# Patient Record
Sex: Female | Born: 1985 | Race: Black or African American | Hispanic: No | Marital: Single | Smoking: Current some day smoker
Health system: Southern US, Community
[De-identification: ages and names within clinical notes are randomized; demographics above are authoritative.]

## PROBLEM LIST (undated history)

## (undated) DIAGNOSIS — I10 Essential (primary) hypertension: Secondary | ICD-10-CM

## (undated) DIAGNOSIS — G4730 Sleep apnea, unspecified: Secondary | ICD-10-CM

## (undated) HISTORY — PX: ROTATOR CUFF REPAIR: SHX139

---

## 2019-08-23 ENCOUNTER — Emergency Department: Payer: Self-pay

## 2019-08-23 ENCOUNTER — Emergency Department
Admission: EM | Admit: 2019-08-23 | Discharge: 2019-08-23 | Disposition: A | Discharge status: Home / Self Care | Payer: Self-pay | Attending: Emergency Medicine | Admitting: Emergency Medicine

## 2019-08-23 ENCOUNTER — Other Ambulatory Visit: Payer: Self-pay

## 2019-08-23 DIAGNOSIS — Y999 Unspecified external cause status: Secondary | ICD-10-CM | POA: Insufficient documentation

## 2019-08-23 DIAGNOSIS — Y939 Activity, unspecified: Secondary | ICD-10-CM | POA: Insufficient documentation

## 2019-08-23 DIAGNOSIS — X58XXXA Exposure to other specified factors, initial encounter: Secondary | ICD-10-CM | POA: Insufficient documentation

## 2019-08-23 DIAGNOSIS — M79641 Pain in right hand: Secondary | ICD-10-CM | POA: Insufficient documentation

## 2019-08-23 DIAGNOSIS — Y929 Unspecified place or not applicable: Secondary | ICD-10-CM | POA: Insufficient documentation

## 2019-08-23 MED ORDER — MELOXICAM 15 MG PO TABS: 15.0000 mg | ORAL_TABLET | Freq: Every day | ORAL | 0 refills | Status: DC

## 2019-08-23 NOTE — ED Provider Notes (Signed)
[facility] Emergency Department Provider Note ____________________________________________  Time seen: Approximately 3:23 PM  I have reviewed the triage vital signs and the nursing notes.   HISTORY  Chief Complaint Hand Pain    HPI Destiny Bryant is a 33 y.o. female who presents to the emergency department for evaluation and treatment of right hand pain. No recent injury. Pain started about 2 weeks ago. No relief with tylenol and massage. Pain worsened today.  History reviewed. No pertinent past medical history.  There are no active problems to display for this patient.   History reviewed. No pertinent surgical history.  Prior to Admission medications   Medication Sig Start Date End Date Taking? Authorizing Provider  meloxicam (MOBIC) 15 MG tablet Take 1 tablet (15 mg total) by mouth daily. 08/23/19   Victorino Dike, FNP    Allergies Patient has no allergy information on record.  No family history on file.  Social History Social History   Tobacco Use  . Smoking status: Never Smoker  . Smokeless tobacco: Never Used  Substance Use Topics  . Alcohol use: Never    Frequency: Never  . Drug use: Never    Review of Systems Constitutional: Negative for fever. Cardiovascular: Negative for chest pain. Respiratory: Negative for shortness of breath. Musculoskeletal: Positive for pain in right thumb. Skin: Sensation of swelling and warmth in right thumb.  Neurological: Negative for decrease in sensation  ____________________________________________   PHYSICAL EXAM:  VITAL SIGNS: ED Triage Vitals  Enc Vitals Group     BP 08/23/19 1511 (!) 151/96     Pulse Rate 08/23/19 1511 87     Resp 08/23/19 1511 18     Temp 08/23/19 1511 98.3 F (36.8 C)     Temp src --      SpO2 08/23/19 1511 100 %     Weight 08/23/19 1512 187 lb (84.8 kg)     Height 08/23/19 1512 5\' 1"  (1.549 m)     Head Circumference --      Peak Flow --      Pain Score  08/23/19 1512 10     Pain Loc --      Pain Edu? --      Excl. in [place]? --     Constitutional: Alert and oriented. Well appearing and in no acute distress. Eyes: Conjunctivae are clear without discharge or drainage Head: Atraumatic Neck: Supple Respiratory: No cough. Respirations are even and unlabored. Musculoskeletal: Pain in MCP and over scaphoid of right thumb without obvious erythema or edema. Neurologic: Motor and sensory function is intact.  Skin: No erythema, swelling or lymphangitis noted over the right thumb or hand.  Psychiatric: Affect and behavior are appropriate.  ____________________________________________   LABS (all labs ordered are listed, but only abnormal results are displayed)  Labs Reviewed - No data to display ____________________________________________  RADIOLOGY  Image of the right hand is reassuring.  No acute fractures. ____________________________________________   PROCEDURES  Procedures  ____________________________________________   INITIAL IMPRESSION / ASSESSMENT AND PLAN / ED COURSE  Destiny Bryant is a 33 y.o. who presents to the emergency department for treatment and evaluation of right thumb pain.  No specific injury recently, however she states that she did break her hand several years ago.  She is right-hand specific.  Exam is reassuring, however due to the pain over the scaphoid imaging will be obtained.  Thumb spica splint applied by ER tech.  Splint to be worn for comfort.  Patient instructed to  follow-up with orthopedics or primary care for symptoms not improving over the week.  She was also instructed to return to the emergency department for symptoms that change or worsen if unable schedule an appointment with orthopedics or primary care.  Medications - No data to display  Pertinent labs & imaging results that were available during my care of the patient were reviewed by me and considered in my medical decision making (see chart  for details).  _________________________________________   FINAL CLINICAL IMPRESSION(S) / ED DIAGNOSES  Final diagnoses:  Right hand pain    ED Discharge Orders         Ordered    meloxicam (MOBIC) 15 MG tablet  Daily     08/23/19 1651           If controlled substance prescribed during this visit, 12 month history viewed on the [facility] prior to issuing an initial prescription for Schedule II or III opiod.   Destiny Pester, FNP 08/23/19 1654    Dionne Bucy, MD 08/23/19 [phone]

## 2019-08-23 NOTE — ED Triage Notes (Signed)
Pt comes via POV from home with c/o right hand pain.  Pt states she broke her hand years ago and noticed couple weeks ago it was started to give her pain. Pt states she has lots of pain and swelling. Pt states it is hard for her to do her ADLs  Pt states limited movement with fingers and hurts around thumb.

## 2019-08-23 NOTE — Discharge Instructions (Signed)
Wear the splint while awake.  You may remove it to shower.  Wear the splint while working.  Take the meloxicam daily until symptoms have resolved.  Follow-up with the primary care provider of your choice for symptoms are not improving over the next week or so.  Return to the emergency department for symptoms that change or worsen if you are unable to schedule an appointment.

## 2019-09-04 ENCOUNTER — Ambulatory Visit: Payer: Self-pay

## 2019-09-05 ENCOUNTER — Ambulatory Visit: Payer: Self-pay

## 2019-09-16 ENCOUNTER — Ambulatory Visit: Payer: Self-pay

## 2020-05-05 ENCOUNTER — Ambulatory Visit: Payer: Medicaid Other | Attending: Neurology

## 2020-05-12 ENCOUNTER — Other Ambulatory Visit: Payer: Self-pay | Admitting: Family Medicine

## 2020-05-12 DIAGNOSIS — S060X0A Concussion without loss of consciousness, initial encounter: Secondary | ICD-10-CM

## 2020-08-05 ENCOUNTER — Emergency Department
Admission: EM | Admit: 2020-08-05 | Discharge: 2020-08-05 | Discharge status: Absent without leave | Payer: Medicaid Other

## 2020-08-11 ENCOUNTER — Other Ambulatory Visit: Payer: Self-pay | Admitting: Physician Assistant

## 2020-08-11 DIAGNOSIS — O26851 Spotting complicating pregnancy, first trimester: Secondary | ICD-10-CM

## 2020-09-24 ENCOUNTER — Ambulatory Visit: Payer: Medicaid Other | Attending: Neurology

## 2020-09-24 DIAGNOSIS — G4733 Obstructive sleep apnea (adult) (pediatric): Secondary | ICD-10-CM | POA: Diagnosis not present

## 2020-09-24 DIAGNOSIS — R0681 Apnea, not elsewhere classified: Secondary | ICD-10-CM | POA: Diagnosis present

## 2020-09-25 ENCOUNTER — Other Ambulatory Visit: Payer: Self-pay

## 2020-09-27 ENCOUNTER — Other Ambulatory Visit: Payer: Self-pay

## 2020-09-27 ENCOUNTER — Encounter: Payer: Self-pay | Admitting: Emergency Medicine

## 2020-09-27 ENCOUNTER — Ambulatory Visit
Admission: EM | Admit: 2020-09-27 | Discharge: 2020-09-27 | Disposition: A | Discharge status: Home / Self Care | Payer: Medicaid Other | Attending: Physician Assistant | Admitting: Physician Assistant

## 2020-09-27 DIAGNOSIS — K529 Noninfective gastroenteritis and colitis, unspecified: Secondary | ICD-10-CM | POA: Insufficient documentation

## 2020-09-27 DIAGNOSIS — R197 Diarrhea, unspecified: Secondary | ICD-10-CM

## 2020-09-27 DIAGNOSIS — Z20822 Contact with and (suspected) exposure to covid-19: Secondary | ICD-10-CM | POA: Insufficient documentation

## 2020-09-27 DIAGNOSIS — R110 Nausea: Secondary | ICD-10-CM | POA: Diagnosis not present

## 2020-09-27 HISTORY — DX: Essential (primary) hypertension: I10

## 2020-09-27 LAB — RESP PANEL BY RT PCR (RSV, FLU A&B, COVID)
Influenza A by PCR: NEGATIVE
Influenza B by PCR: NEGATIVE
Respiratory Syncytial Virus by PCR: NEGATIVE
SARS Coronavirus 2 by RT PCR: NEGATIVE

## 2020-09-27 MED ORDER — ONDANSETRON 4 MG PO TBDP: 4.0000 mg | ORAL_TABLET | Freq: Four times a day (QID) | ORAL | 0 refills | Status: AC | PRN

## 2020-09-27 MED ORDER — LOPERAMIDE HCL 2 MG PO CAPS: 2.0000 mg | ORAL_CAPSULE | Freq: Four times a day (QID) | ORAL | 0 refills | Status: AC | PRN

## 2020-09-27 NOTE — Discharge Instructions (Signed)

## 2020-09-27 NOTE — ED Provider Notes (Signed)
[facility]    CSN: [id number] Arrival date & time: 09/27/20  1510      History   Chief Complaint Chief Complaint  Patient presents with  . Diarrhea  . Nausea    HPI Destiny Bryant is a 34 y.o. female presenting for diarrhea and nausea since yesterday. Patient admits to mild abdominal cramping without pain. Denies fever, fatigue, weakness. States she had one episode of vomiting. Admits to continued nausea. Denies cough, congestion, sore throat, chest pain, SOB. Denies dysuria, urinary frequency, back pain, abnormal vaginal discharge. States she ate lobster right before symptoms began. She denies any food allergies or intolerances. Has not taken any meds for symptoms. No other concerns today.  HPI  Past Medical History:  Diagnosis Date  . Hypertension     There are no problems to display for this patient.   History reviewed. No pertinent surgical history.  OB History   No obstetric history on file.      Home Medications    Prior to Admission medications   Medication Sig Start Date End Date Taking? Authorizing Provider  cyclobenzaprine (FLEXERIL) 10 MG tablet Take 10 mg by mouth every 8 (eight) hours as needed. 09/15/20  Yes [provider]  hydrochlorothiazide (HYDRODIURIL) 25 MG tablet Take 25 mg by mouth daily. 07/14/20  Yes [provider]  meloxicam (MOBIC) 15 MG tablet Take 1 tablet (15 mg total) by mouth daily. 08/23/19  Yes Triplett, Cari B, FNP  sertraline (ZOLOFT) 100 MG tablet Take 100 mg by mouth daily. 07/10/20  Yes [provider]  loperamide (IMODIUM) 2 MG capsule Take 1 capsule (2 mg total) by mouth 4 (four) times daily as needed for up to 5 days for diarrhea or loose stools. 09/27/20 10/02/20  Eusebio Friendly B, PA-C  ondansetron (ZOFRAN ODT) 4 MG disintegrating tablet Take 1 tablet (4 mg total) by mouth every 6 (six) hours as needed for up to 5 days for nausea or vomiting. 09/27/20 10/02/20  Shirlee Latch, PA-C     Family History Family History  Problem Relation Age of Onset  . Healthy Mother   . Healthy Father     Social History Social History   Tobacco Use  . Smoking status: Never Smoker  . Smokeless tobacco: Never Used  Vaping Use  . Vaping Use: Never used  Substance Use Topics  . Alcohol use: Never  . Drug use: Never     Allergies   Patient has no known allergies.   Review of Systems Review of Systems  Constitutional: Negative for chills, diaphoresis, fatigue and fever.  HENT: Negative for congestion, ear pain, rhinorrhea, sinus pressure, sinus pain and sore throat.   Respiratory: Negative for cough and shortness of breath.   Cardiovascular: Negative for chest pain.  Gastrointestinal: Positive for diarrhea and nausea. Negative for abdominal pain, blood in stool and vomiting.  Musculoskeletal: Negative for arthralgias and myalgias.  Skin: Negative for rash.  Neurological: Negative for weakness and headaches.  Hematological: Negative for adenopathy.     Physical Exam Triage Vital Signs ED Triage Vitals [09/27/20 1529]  Enc Vitals Group     BP (!) 143/92     Pulse Rate 87     Resp 14     Temp 99 F (37.2 C)     Temp Source Oral     SpO2 97 %     Weight 195 lb (88.5 kg)     Height 5\' 1"  (1.549 m)     Head  Circumference      Peak Flow      Pain Score 6     Pain Loc      Pain Edu?      Excl. in [place]?    No data found.  Updated Vital Signs BP (!) 143/92 (BP Location: Left Arm)   Pulse 87   Temp 99 F (37.2 C) (Oral)   Resp 14   Ht 5\' 1"  (1.549 m)   Wt 195 lb (88.5 kg)   LMP 09/07/2020 (Approximate)   SpO2 97%   BMI 36.84 kg/m        Physical Exam Vitals and nursing note reviewed.  Constitutional:      General: She is not in acute distress.    Appearance: Normal appearance. She is not ill-appearing or toxic-appearing.  HENT:     Head: Normocephalic and atraumatic.     Nose: Nose normal.     Mouth/Throat:     Mouth: Mucous membranes are moist.      Pharynx: Oropharynx is clear.  Eyes:     General: No scleral icterus.       Right eye: No discharge.        Left eye: No discharge.     Conjunctiva/sclera: Conjunctivae normal.  Cardiovascular:     Rate and Rhythm: Normal rate and regular rhythm.     Heart sounds: Normal heart sounds.  Pulmonary:     Effort: Pulmonary effort is normal. No respiratory distress.     Breath sounds: Normal breath sounds.  Abdominal:     Palpations: Abdomen is soft.     Tenderness: There is no abdominal tenderness.  Musculoskeletal:     Cervical back: Neck supple.  Skin:    General: Skin is dry.  Neurological:     General: No focal deficit present.     Mental Status: She is alert. Mental status is at baseline.     Motor: No weakness.     Gait: Gait normal.  Psychiatric:        Mood and Affect: Mood normal.        Behavior: Behavior normal.        Thought Content: Thought content normal.      UC Treatments / Results  Labs (all labs ordered are listed, but only abnormal results are displayed) Labs Reviewed  RESP PANEL BY RT PCR (RSV, FLU A&B, COVID)    EKG   Radiology SLEEP STUDY DOCUMENTS  Result Date: 09/28/2020 Ordered by an unspecified provider.   Procedures Procedures (including critical care time)  Medications Ordered in UC Medications - No data to display  Initial Impression / Assessment and Plan / UC Course  I have reviewed the triage vital signs and the nursing notes.  Pertinent labs & imaging results that were available during my care of the patient were reviewed by me and considered in my medical decision making (see chart for details).   34 y/o female presenting for diarrhea and nausea. VSS and NAD. No abdomial tenderness. Suspect viral gastroenteritis vs food intolerance. Treating with imodium and Zofran. Resp panel sent. CDC guidelines regarding isolation protocol and ED precautions discussed. Advised increasing rest and fluids. ED precautions for abdominal pain  and worsening symptoms discussed.   Final Clinical Impressions(s) / UC Diagnoses   Final diagnoses:  Noninfectious gastroenteritis, unspecified type  Diarrhea, unspecified type  Nausea     Discharge Instructions     You have received COVID testing today either for positive exposure, concerning symptoms that  could be related to COVID infection, screening purposes, or re-testing after confirmed positive.  Your test obtained today checks for active viral infection in the last 1-2 weeks. If your test is negative now, you can still test positive later. So, if you do develop symptoms you should either get re-tested and/or isolate x 10 days. Please follow CDC guidelines.  While Rapid antigen tests come back in 15-20 minutes, send out PCR/molecular test results typically come back within 24 hours. In the mean time, if you are symptomatic, assume this could be a positive test and treat/monitor yourself as if you do have COVID.   We will call with test results. Please download the MyChart app and set up a profile to access test results.   If symptomatic, go home and rest. Push fluids. Take Tylenol as needed for discomfort. Gargle warm salt water. Throat lozenges. Take Mucinex DM or Robitussin for cough. Humidifier in bedroom to ease coughing. Warm showers. Also review the COVID handout for more information.  COVID-19 INFECTION: The incubation period of COVID-19 is approximately 14 days after exposure, with most symptoms developing in roughly 4-5 days. Symptoms may range in severity from mild to critically severe. Roughly 80% of those infected will have mild symptoms. People of any age may become infected with COVID-19 and have the ability to transmit the virus. The most common symptoms include: fever, fatigue, cough, body aches, headaches, sore throat, nasal congestion, shortness of breath, nausea, vomiting, diarrhea, changes in smell and/or taste.    COURSE OF ILLNESS Some patients may begin with  mild disease which can progress quickly into critical symptoms. If your symptoms are worsening please call ahead to the Emergency Department and proceed there for further treatment. Recovery time appears to be roughly 1-2 weeks for mild symptoms and 3-6 weeks for severe disease.   GO IMMEDIATELY TO ER FOR FEVER YOU ARE UNABLE TO GET DOWN WITH TYLENOL, BREATHING PROBLEMS, CHEST PAIN, FATIGUE, LETHARGY, INABILITY TO EAT OR DRINK, ETC  QUARANTINE AND ISOLATION: To help decrease the spread of COVID-19 please remain isolated if you have COVID infection or are highly suspected to have COVID infection. This means -stay home and isolate to one room in the home if you live with others. Do not share a bed or bathroom with others while ill, sanitize and wipe down all countertops and keep common areas clean and disinfected. You may discontinue isolation if you have a mild case and are asymptomatic 10 days after symptom onset as long as you have been fever free >24 hours without having to take Motrin or Tylenol. If your case is more severe (meaning you develop pneumonia or are admitted in the hospital), you may have to isolate longer.   If you have been in close contact (within 6 feet) of someone diagnosed with COVID 19, you are advised to quarantine in your home for 14 days as symptoms can develop anywhere from 2-14 days after exposure to the virus. If you develop symptoms, you  must isolate.  Most current guidelines for COVID after exposure -isolate 10 days if you ARE NOT tested for COVID as long as symptoms do not develop -isolate 7 days if you are tested and remain asymptomatic -You do not necessarily need to be tested for COVID if you have + exposure and        develop   symptoms. Just isolate at home x10 days from symptom onset During this global pandemic, CDC advises to practice social distancing, try to stay at  least 28ft away from others at all times. Wear a face covering. Wash and sanitize your hands  regularly and avoid going anywhere that is not necessary.  KEEP IN MIND THAT THE COVID TEST IS NOT 100% ACCURATE AND YOU SHOULD STILL DO EVERYTHING TO PREVENT POTENTIAL SPREAD OF VIRUS TO OTHERS (WEAR MASK, WEAR GLOVES, WASH HANDS AND SANITIZE REGULARLY). IF INITIAL TEST IS NEGATIVE, THIS MAY NOT MEAN YOU ARE DEFINITELY NEGATIVE. MOST ACCURATE TESTING IS DONE 5-7 DAYS AFTER EXPOSURE.   It is not advised by CDC to get re-tested after receiving a positive COVID test since you can still test positive for weeks to months after you have already cleared the virus.   *If you have not been vaccinated for COVID, I strongly suggest you consider getting vaccinated as long as there are no contraindications.      ED Prescriptions    Medication Sig Dispense Auth. Provider   ondansetron (ZOFRAN ODT) 4 MG disintegrating tablet Take 1 tablet (4 mg total) by mouth every 6 (six) hours as needed for up to 5 days for nausea or vomiting. 20 tablet Eusebio Friendly B, PA-C   loperamide (IMODIUM) 2 MG capsule Take 1 capsule (2 mg total) by mouth 4 (four) times daily as needed for up to 5 days for diarrhea or loose stools. 20 capsule Shirlee Latch, PA-C     PDMP not reviewed this encounter.   Shirlee Latch, PA-C 09/29/20 1229

## 2020-09-27 NOTE — ED Triage Notes (Signed)
Patient c/o diarrhea and nausea that started yesterday.  Patient states that she had a COVID test 2 days ago and was negative.  Patient denies fevers.

## 2020-10-29 ENCOUNTER — Ambulatory Visit: Payer: Medicaid Other | Attending: Family Medicine

## 2020-11-05 ENCOUNTER — Ambulatory Visit: Payer: Medicaid Other

## 2020-12-01 ENCOUNTER — Other Ambulatory Visit: Payer: Self-pay

## 2020-12-01 ENCOUNTER — Ambulatory Visit
Admission: EM | Admit: 2020-12-01 | Discharge: 2020-12-01 | Disposition: A | Discharge status: Home / Self Care | Payer: Medicaid Other | Attending: Family Medicine | Admitting: Family Medicine

## 2020-12-01 ENCOUNTER — Encounter: Payer: Self-pay | Admitting: Emergency Medicine

## 2020-12-01 DIAGNOSIS — Z20822 Contact with and (suspected) exposure to covid-19: Secondary | ICD-10-CM | POA: Diagnosis not present

## 2020-12-01 DIAGNOSIS — B349 Viral infection, unspecified: Secondary | ICD-10-CM | POA: Insufficient documentation

## 2020-12-01 DIAGNOSIS — F17210 Nicotine dependence, cigarettes, uncomplicated: Secondary | ICD-10-CM | POA: Insufficient documentation

## 2020-12-01 DIAGNOSIS — R519 Headache, unspecified: Secondary | ICD-10-CM | POA: Diagnosis not present

## 2020-12-01 DIAGNOSIS — R059 Cough, unspecified: Secondary | ICD-10-CM | POA: Diagnosis not present

## 2020-12-01 MED ORDER — BENZONATATE 100 MG PO CAPS: 200.0000 mg | ORAL_CAPSULE | Freq: Three times a day (TID) | ORAL | 0 refills | Status: AC | PRN

## 2020-12-01 NOTE — ED Provider Notes (Signed)
[facility]    CSN: [id number] Arrival date & time: 12/01/20  1407      History   Chief Complaint Chief Complaint  Patient presents with  . Cough  . Headache    HPI Destiny Bryant is a 35 y.o. female presenting for 2 day history of cough and headache. Her children are ill with similar symptoms.  Patient denies any known COVID-19 exposure and has been fully vaccinated for COVID-19.  Has been taking over-the-counter cough syrups but would like to be different for cough.  She denies any fever, fatigue, body aches, congestion, sore throat, chest pain, difficulty breathing, abdominal pain, nausea/vomiting or diarrhea.  Past medical history is significant for hypertension and she states she has not taken her blood pressure medicine today.  Denies any history of cardiopulmonary disease.  No other complaints or concerns.  HPI  Past Medical History:  Diagnosis Date  . Hypertension     There are no problems to display for this patient.   Past Surgical History:  Procedure Laterality Date  . ROTATOR CUFF REPAIR      OB History   No obstetric history on file.      Home Medications    Prior to Admission medications   Medication Sig Start Date End Date Taking? Authorizing Provider  benzonatate (TESSALON) 100 MG capsule Take 2 capsules (200 mg total) by mouth 3 (three) times daily as needed for up to 7 days for cough. 12/01/20 12/08/20 Yes Shirlee Latch, PA-C  cyclobenzaprine (FLEXERIL) 10 MG tablet Take 10 mg by mouth every 8 (eight) hours as needed. 09/15/20  Yes [provider]  hydrochlorothiazide (HYDRODIURIL) 25 MG tablet Take 25 mg by mouth daily. 07/14/20  Yes [provider]  sertraline (ZOLOFT) 100 MG tablet Take 100 mg by mouth daily. 07/10/20  Yes [provider]  meloxicam (MOBIC) 15 MG tablet Take 1 tablet (15 mg total) by mouth daily. 08/23/19   Chinita Pester, FNP    Family History Family History  Problem Relation Age of Onset   . Hypertension Mother   . Hypertension Father     Social History Social History   Tobacco Use  . Smoking status: Current Some Day Smoker  . Smokeless tobacco: Never Used  . Tobacco comment: 1 cig every 2-3 days  Vaping Use  . Vaping Use: Never used  Substance Use Topics  . Alcohol use: Yes    Comment: social  . Drug use: Never     Allergies   Patient has no known allergies.   Review of Systems Review of Systems  Constitutional: Negative for chills, diaphoresis, fatigue and fever.  HENT: Negative for congestion, ear pain, rhinorrhea, sinus pressure, sinus pain and sore throat.   Respiratory: Positive for cough. Negative for shortness of breath.   Gastrointestinal: Negative for abdominal pain, nausea and vomiting.  Musculoskeletal: Negative for arthralgias and myalgias.  Skin: Negative for rash.  Neurological: Positive for headaches. Negative for weakness.  Hematological: Negative for adenopathy.     Physical Exam Triage Vital Signs ED Triage Vitals  Enc Vitals Group     BP 12/01/20 1527 (!) 146/83     Pulse Rate 12/01/20 1527 92     Resp 12/01/20 1527 18     Temp 12/01/20 1527 98.6 F (37 C)     Temp Source 12/01/20 1527 Oral     SpO2 --      Weight 12/01/20 1528 190 lb (86.2 kg)     Height  12/01/20 1528 5\' 1"  (1.549 m)     Head Circumference --      Peak Flow --      Pain Score 12/01/20 1525 3     Pain Loc --      Pain Edu? --      Excl. in GC? --    No data found.  Updated Vital Signs BP (!) 146/83 (BP Location: Left Arm) Comment: patient has not taken her HTN meds today  Pulse 92   Temp 98.6 F (37 C) (Oral)   Resp 18   Ht 5\' 1"  (1.549 m)   Wt 190 lb (86.2 kg)   LMP 11/10/2020 (Approximate)   SpO2 98%   BMI 35.90 kg/m       Physical Exam Vitals and nursing note reviewed.  Constitutional:      General: She is not in acute distress.    Appearance: Normal appearance. She is not ill-appearing or toxic-appearing.  HENT:     Head:  Normocephalic and atraumatic.     Nose: Nose normal.     Mouth/Throat:     Mouth: Mucous membranes are moist.     Pharynx: Oropharynx is clear.  Eyes:     General: No scleral icterus.       Right eye: No discharge.        Left eye: No discharge.     Conjunctiva/sclera: Conjunctivae normal.  Cardiovascular:     Rate and Rhythm: Normal rate and regular rhythm.     Heart sounds: Normal heart sounds.  Pulmonary:     Effort: Pulmonary effort is normal. No respiratory distress.     Breath sounds: Normal breath sounds. No wheezing, rhonchi or rales.  Musculoskeletal:     Cervical back: Neck supple.  Skin:    General: Skin is dry.  Neurological:     General: No focal deficit present.     Mental Status: She is alert. Mental status is at baseline.     Motor: No weakness.     Gait: Gait normal.  Psychiatric:        Mood and Affect: Mood normal.        Behavior: Behavior normal.        Thought Content: Thought content normal.      UC Treatments / Results  Labs (all labs ordered are listed, but only abnormal results are displayed) Labs Reviewed  SARS CORONAVIRUS 2 (TAT 6-24 HRS)    EKG   Radiology No results found.  Procedures Procedures (including critical care time)  Medications Ordered in UC Medications - No data to display  Initial Impression / Assessment and Plan / UC Course  I have reviewed the triage vital signs and the nursing notes.  Pertinent labs & imaging results that were available during my care of the patient were reviewed by me and considered in my medical decision making (see chart for details).   Vital signs are all stable and patient is in no acute distress.  Suspect viral illness, potentially COVID-19.  Send out COVID-19 testing.  Current CDC guidelines, isolation protocol and ED precautions reviewed with patient.  Sent benzonatate to pharmacy for cough.  Encourage increasing rest and fluids.  Encouraged Tylenol/Motrin for headaches.  Advised to  follow-up with our clinic as needed for any new or worsening symptoms.   Final Clinical Impressions(s) / UC Diagnoses   Final diagnoses:  Viral illness  Cough  Acute nonintractable headache, unspecified headache type     Discharge Instructions  You have received COVID testing today either for positive exposure, concerning symptoms that could be related to COVID infection, screening purposes, or re-testing after confirmed positive.  Your test obtained today checks for active viral infection in the last 1-2 weeks. If your test is negative now, you can still test positive later. So, if you do develop symptoms you should either get re-tested and/or isolate x 5 days and then strict mask use x 5 days (unvaccinated) or mask use x 10 days (vaccinated). Please follow CDC guidelines.  While Rapid antigen tests come back in 15-20 minutes, send out PCR/molecular test results typically come back within 1-3 days. In the mean time, if you are symptomatic, assume this could be a positive test and treat/monitor yourself as if you do have COVID.   We will call with test results if positive. Please download the MyChart app and set up a profile to access test results.   If symptomatic, go home and rest. Push fluids. Take Tylenol as needed for discomfort. Gargle warm salt water. Throat lozenges. Take Mucinex DM or Robitussin for cough. Humidifier in bedroom to ease coughing. Warm showers. Also review the COVID handout for more information.  COVID-19 INFECTION: The incubation period of COVID-19 is approximately 14 days after exposure, with most symptoms developing in roughly 4-5 days. Symptoms may range in severity from mild to critically severe. Roughly 80% of those infected will have mild symptoms. People of any age may become infected with COVID-19 and have the ability to transmit the virus. The most common symptoms include: fever, fatigue, cough, body aches, headaches, sore throat, nasal congestion,  shortness of breath, nausea, vomiting, diarrhea, changes in smell and/or taste.    COURSE OF ILLNESS Some patients may begin with mild disease which can progress quickly into critical symptoms. If your symptoms are worsening please call ahead to the Emergency Department and proceed there for further treatment. Recovery time appears to be roughly 1-2 weeks for mild symptoms and 3-6 weeks for severe disease.   GO IMMEDIATELY TO ER FOR FEVER YOU ARE UNABLE TO GET DOWN WITH TYLENOL, BREATHING PROBLEMS, CHEST PAIN, FATIGUE, LETHARGY, INABILITY TO EAT OR DRINK, ETC  QUARANTINE AND ISOLATION: To help decrease the spread of COVID-19 please remain isolated if you have COVID infection or are highly suspected to have COVID infection. This means -stay home and isolate to one room in the home if you live with others. Do not share a bed or bathroom with others while ill, sanitize and wipe down all countertops and keep common areas clean and disinfected. Stay home for 5 days. If you have no symptoms or your symptoms are resolving after 5 days, you can leave your house. Continue to wear a mask around others for 5 additional days. If you have been in close contact (within 6 feet) of someone diagnosed with COVID 19, you are advised to quarantine in your home for 14 days as symptoms can develop anywhere from 2-14 days after exposure to the virus. If you develop symptoms, you  must isolate.  Most current guidelines for COVID after exposure -unvaccinated: isolate 5 days and strict mask use x 5 days. Test on day 5 is possible -vaccinated: wear mask x 10 days if symptoms do not develop -You do not necessarily need to be tested for COVID if you have + exposure and  develop symptoms. Just isolate at home x10 days from symptom onset During this global pandemic, CDC advises to practice social distancing, try to stay at least  49ft away from others at all times. Wear a face covering. Wash and sanitize your hands regularly and avoid  going anywhere that is not necessary.  KEEP IN MIND THAT THE COVID TEST IS NOT 100% ACCURATE AND YOU SHOULD STILL DO EVERYTHING TO PREVENT POTENTIAL SPREAD OF VIRUS TO OTHERS (WEAR MASK, WEAR GLOVES, WASH HANDS AND SANITIZE REGULARLY). IF INITIAL TEST IS NEGATIVE, THIS MAY NOT MEAN YOU ARE DEFINITELY NEGATIVE. MOST ACCURATE TESTING IS DONE 5-7 DAYS AFTER EXPOSURE.   It is not advised by CDC to get re-tested after receiving a positive COVID test since you can still test positive for weeks to months after you have already cleared the virus.   *If you have not been vaccinated for COVID, I strongly suggest you consider getting vaccinated as long as there are no contraindications.      ED Prescriptions    Medication Sig Dispense Auth. Provider   benzonatate (TESSALON) 100 MG capsule Take 2 capsules (200 mg total) by mouth 3 (three) times daily as needed for up to 7 days for cough. 21 capsule Shirlee Latch, PA-C     PDMP not reviewed this encounter.   Shirlee Latch, PA-C 12/01/20 [phone]

## 2020-12-01 NOTE — ED Triage Notes (Signed)
Patient in today c/o cough x 2 days and headache this morning. Patient denies fever. Patient has had the covid vaccines.

## 2020-12-01 NOTE — Discharge Instructions (Signed)

## 2020-12-02 LAB — SARS CORONAVIRUS 2 (TAT 6-24 HRS): SARS Coronavirus 2: NEGATIVE

## 2020-12-27 ENCOUNTER — Ambulatory Visit (INDEPENDENT_AMBULATORY_CARE_PROVIDER_SITE_OTHER): Payer: Medicaid Other

## 2020-12-27 ENCOUNTER — Ambulatory Visit
Admission: EM | Admit: 2020-12-27 | Discharge: 2020-12-27 | Disposition: A | Discharge status: Home / Self Care | Payer: Medicaid Other | Attending: Emergency Medicine | Admitting: Emergency Medicine

## 2020-12-27 ENCOUNTER — Other Ambulatory Visit: Payer: Self-pay

## 2020-12-27 DIAGNOSIS — R059 Cough, unspecified: Secondary | ICD-10-CM

## 2020-12-27 DIAGNOSIS — R062 Wheezing: Secondary | ICD-10-CM | POA: Diagnosis not present

## 2020-12-27 DIAGNOSIS — R0602 Shortness of breath: Secondary | ICD-10-CM | POA: Diagnosis not present

## 2020-12-27 MED ORDER — ALBUTEROL SULFATE HFA 108 (90 BASE) MCG/ACT IN AERS: 2.0000 | INHALATION_SPRAY | RESPIRATORY_TRACT | 0 refills | Status: DC | PRN

## 2020-12-27 MED ORDER — AEROCHAMBER MV MISC: 2 refills | Status: DC

## 2020-12-27 MED ORDER — PREDNISONE 10 MG (21) PO TBPK: ORAL_TABLET | Freq: Every day | ORAL | 0 refills | Status: DC

## 2020-12-27 MED ORDER — BENZONATATE 100 MG PO CAPS: 200.0000 mg | ORAL_CAPSULE | Freq: Three times a day (TID) | ORAL | 0 refills | Status: DC

## 2020-12-27 NOTE — Discharge Instructions (Addendum)
Start the prednisone tomorrow morning with breakfast and take it every morning thereafter.  This will help decrease the inflammation in your lungs and help you breathe better.  Use the albuterol inhaler with the spacer, 2 puffs every 4-6 hours, as needed for chest tightness and wheezing.  Use the Tessalon Perles every 8 hours as needed for cough.  If your symptoms continue follow-up with your primary care provider.

## 2020-12-27 NOTE — ED Provider Notes (Signed)
[facility]    CSN: [id number] Arrival date & time: 12/27/20  1435      History   Chief Complaint Chief Complaint  Patient presents with  . Cough    HPI Destiny Bryant is a 35 y.o. female.   HPI   35 year old female here for evaluation of cough, chest congestion, and fatigue.  Patient reports that she has had her cough and chest congestion for couple days and the fatigue started today.  She describes a feeling of tightness in her chest and she has some wheezing.  Her cough is typically dry but occasionally if she coughs hard enough she can bring up some white phlegm.  Patient denies fever.  Patient was recently diagnosed with sleep apnea and she is awaiting her CPAP machine.  Past Medical History:  Diagnosis Date  . Hypertension     There are no problems to display for this patient.   Past Surgical History:  Procedure Laterality Date  . ROTATOR CUFF REPAIR      OB History   No obstetric history on file.      Home Medications    Prior to Admission medications   Medication Sig Start Date End Date Taking? Authorizing Provider  albuterol (VENTOLIN HFA) 108 (90 Base) MCG/ACT inhaler Inhale 2 puffs into the lungs every 4 (four) hours as needed. 12/27/20  Yes Becky Augusta, NP  benzonatate (TESSALON) 100 MG capsule Take 2 capsules (200 mg total) by mouth every 8 (eight) hours. 12/27/20  Yes Becky Augusta, NP  hydrochlorothiazide (HYDRODIURIL) 25 MG tablet Take 25 mg by mouth daily. 07/14/20  Yes [provider]  predniSONE (STERAPRED UNI-PAK 21 TAB) 10 MG (21) TBPK tablet Take by mouth daily. Take 6 tabs by mouth daily  for 2 days, then 5 tabs for 2 days, then 4 tabs for 2 days, then 3 tabs for 2 days, 2 tabs for 2 days, then 1 tab by mouth daily for 2 days 12/27/20  Yes Becky Augusta, NP  sertraline (ZOLOFT) 100 MG tablet Take 100 mg by mouth daily. 07/10/20  Yes [provider]  Spacer/Aero-Holding Chambers (AEROCHAMBER MV) inhaler Use as  instructed 12/27/20  Yes Becky Augusta, NP    Family History Family History  Problem Relation Age of Onset  . Hypertension Mother   . Hypertension Father     Social History Social History   Tobacco Use  . Smoking status: Current Some Day Smoker  . Smokeless tobacco: Never Used  . Tobacco comment: 1 cig every 2-3 days  Vaping Use  . Vaping Use: Never used  Substance Use Topics  . Alcohol use: Yes    Comment: social  . Drug use: Never     Allergies   Patient has no known allergies.   Review of Systems Review of Systems  Constitutional: Positive for fatigue. Negative for activity change, appetite change and fever.  Respiratory: Positive for cough, chest tightness, shortness of breath and wheezing.   Gastrointestinal: Negative for nausea and vomiting.  Skin: Negative for rash.  Hematological: Negative.   Psychiatric/Behavioral: Negative.      Physical Exam Triage Vital Signs ED Triage Vitals  Enc Vitals Group     BP 12/27/20 1502 (!) 149/87     Pulse Rate 12/27/20 1502 86     Resp 12/27/20 1502 (!) 24     Temp 12/27/20 1502 98.7 F (37.1 C)     Temp Source 12/27/20 1502 Oral     SpO2 12/27/20 1502 95 %  Weight 12/27/20 1500 202 lb (91.6 kg)     Height 12/27/20 1500 5\' 1"  (1.549 m)     Head Circumference --      Peak Flow --      Pain Score 12/27/20 1459 0     Pain Loc --      Pain Edu? --      Excl. in GC? --    No data found.  Updated Vital Signs BP (!) 149/87 (BP Location: Right Arm)   Pulse 86   Temp 98.7 F (37.1 C) (Oral)   Resp (!) 24   Ht 5\' 1"  (1.549 m)   Wt 202 lb (91.6 kg)   LMP 12/21/2020   SpO2 95%   BMI 38.17 kg/m   Visual Acuity Right Eye Distance:   Left Eye Distance:   Bilateral Distance:    Right Eye Near:   Left Eye Near:    Bilateral Near:     Physical Exam Vitals and nursing note reviewed.  Constitutional:      General: She is not in acute distress.    Appearance: Normal appearance. She is not ill-appearing.   HENT:     Head: Normocephalic and atraumatic.  Cardiovascular:     Rate and Rhythm: Normal rate and regular rhythm.     Pulses: Normal pulses.     Heart sounds: Normal heart sounds. No murmur heard. No gallop.   Pulmonary:     Effort: Pulmonary effort is normal.     Breath sounds: Wheezing present. No rhonchi or rales.  Skin:    General: Skin is warm and dry.     Capillary Refill: Capillary refill takes less than 2 seconds.     Findings: No erythema or rash.  Neurological:     General: No focal deficit present.     Mental Status: She is alert and oriented to person, place, and time.  Psychiatric:        Mood and Affect: Mood normal.        Behavior: Behavior normal.        Thought Content: Thought content normal.        Judgment: Judgment normal.      UC Treatments / Results  Labs (all labs ordered are listed, but only abnormal results are displayed) Labs Reviewed - No data to display  EKG   Radiology DG Chest 2 View  Result Date: 12/27/2020 CLINICAL DATA:  Cough and shortness of breath. EXAM: CHEST - 2 VIEW COMPARISON:  None. FINDINGS: The heart size and mediastinal contours are within normal limits. Both lungs are clear. The visualized skeletal structures are unremarkable. IMPRESSION: No active cardiopulmonary disease. Electronically Signed   By: 02/18/2021 M.D.   On: 12/27/2020 15:26    Procedures Procedures (including critical care time)  Medications Ordered in UC Medications - No data to display  Initial Impression / Assessment and Plan / UC Course  I have reviewed the triage vital signs and the nursing notes.  Pertinent labs & imaging results that were available during my care of the patient were reviewed by me and considered in my medical decision making (see chart for details).   Patient is a very pleasant 35 year old female whose mildly dyspneic with some audible wheezing who presents for evaluation of chest congestion and cough.  Patient was  recently diagnosed with sleep apnea but has not started using a CPAP machine yet.  Patient is able to speak in full sentences.  Physical exam reveals diminished lung sounds  in all fields with wheezing present in all fields throughout the entire respiratory course.  Will obtain chest x-ray to look for acute intrathoracic process.  Radiology interpretation of chest x-ray is no active cardiopulmonary disease.  We will discharge patient home with a diagnosis of cough and wheezing.  Will prescribe patient albuterol inhaler with a spacer to assist in her wheezing as well as Tessalon Perles to help with her cough, and prednisone.  Final Clinical Impressions(s) / UC Diagnoses   Final diagnoses:  Cough  Wheezing     Discharge Instructions     Start the prednisone tomorrow morning with breakfast and take it every morning thereafter.  This will help decrease the inflammation in your lungs and help you breathe better.  Use the albuterol inhaler with the spacer, 2 puffs every 4-6 hours, as needed for chest tightness and wheezing.  Use the Tessalon Perles every 8 hours as needed for cough.  If your symptoms continue follow-up with your primary care provider.    ED Prescriptions    Medication Sig Dispense Auth. Provider   albuterol (VENTOLIN HFA) 108 (90 Base) MCG/ACT inhaler Inhale 2 puffs into the lungs every 4 (four) hours as needed. 18 g Becky Augusta, NP   Spacer/Aero-Holding Chambers (AEROCHAMBER MV) inhaler Use as instructed 1 each Becky Augusta, NP   benzonatate (TESSALON) 100 MG capsule Take 2 capsules (200 mg total) by mouth every 8 (eight) hours. 21 capsule Becky Augusta, NP   predniSONE (STERAPRED UNI-PAK 21 TAB) 10 MG (21) TBPK tablet Take by mouth daily. Take 6 tabs by mouth daily  for 2 days, then 5 tabs for 2 days, then 4 tabs for 2 days, then 3 tabs for 2 days, 2 tabs for 2 days, then 1 tab by mouth daily for 2 days 42 tablet Becky Augusta, NP     PDMP not reviewed this encounter.    Becky Augusta, NP 12/27/20 1538

## 2020-12-27 NOTE — ED Triage Notes (Signed)
Patient states that she has been having a cough with chest congestion. States that she woke up feeling fatigued today.

## 2021-01-11 ENCOUNTER — Ambulatory Visit: Payer: Medicaid Other | Attending: Neurology

## 2021-01-11 DIAGNOSIS — G4733 Obstructive sleep apnea (adult) (pediatric): Secondary | ICD-10-CM | POA: Diagnosis not present

## 2021-01-12 ENCOUNTER — Other Ambulatory Visit: Payer: Self-pay

## 2021-02-03 ENCOUNTER — Ambulatory Visit
Admission: EM | Admit: 2021-02-03 | Discharge: 2021-02-03 | Disposition: A | Discharge status: Home / Self Care | Payer: Medicaid Other | Attending: Physician Assistant | Admitting: Physician Assistant

## 2021-02-03 ENCOUNTER — Other Ambulatory Visit: Payer: Self-pay

## 2021-02-03 DIAGNOSIS — M791 Myalgia, unspecified site: Secondary | ICD-10-CM | POA: Insufficient documentation

## 2021-02-03 DIAGNOSIS — D72829 Elevated white blood cell count, unspecified: Secondary | ICD-10-CM | POA: Diagnosis not present

## 2021-02-03 HISTORY — DX: Sleep apnea, unspecified: G47.30

## 2021-02-03 LAB — URINALYSIS, COMPLETE (UACMP) WITH MICROSCOPIC
Bilirubin Urine: NEGATIVE
Glucose, UA: NEGATIVE mg/dL
Ketones, ur: NEGATIVE mg/dL
Leukocytes,Ua: NEGATIVE
Nitrite: NEGATIVE
Protein, ur: 30 mg/dL — AB
Specific Gravity, Urine: 1.025 (ref 1.005–1.030)
pH: 5.5 (ref 5.0–8.0)

## 2021-02-03 LAB — COMPREHENSIVE METABOLIC PANEL
ALT: 63 U/L — ABNORMAL HIGH (ref 0–44)
AST: 54 U/L — ABNORMAL HIGH (ref 15–41)
Albumin: 4.5 g/dL (ref 3.5–5.0)
Alkaline Phosphatase: 80 U/L (ref 38–126)
Anion gap: 13 (ref 5–15)
BUN: 15 mg/dL (ref 6–20)
CO2: 21 mmol/L — ABNORMAL LOW (ref 22–32)
Calcium: 9.9 mg/dL (ref 8.9–10.3)
Chloride: 100 mmol/L (ref 98–111)
Creatinine, Ser: 0.76 mg/dL (ref 0.44–1.00)
GFR, Estimated: >60 mL/min (ref >60)
Glucose, Bld: 94 mg/dL (ref 70–99)
Potassium: 4.3 mmol/L (ref 3.5–5.1)
Sodium: 134 mmol/L — ABNORMAL LOW (ref 135–145)
Total Bilirubin: 0.6 mg/dL (ref 0.3–1.2)
Total Protein: 8.5 g/dL — ABNORMAL HIGH (ref 6.5–8.1)

## 2021-02-03 LAB — CBC WITH DIFFERENTIAL/PLATELET
Abs Immature Granulocytes: 0.19 10*3/uL — ABNORMAL HIGH (ref 0.00–0.07)
Basophils Absolute: 0.1 10*3/uL (ref 0.0–0.1)
Basophils Relative: 0 %
Eosinophils Absolute: 0.1 10*3/uL (ref 0.0–0.5)
Eosinophils Relative: 0 %
HCT: 43.6 % (ref 36.0–46.0)
Hemoglobin: 14.4 g/dL (ref 12.0–15.0)
Immature Granulocytes: 1 %
Lymphocytes Relative: 22 %
Lymphs Abs: 4.7 10*3/uL — ABNORMAL HIGH (ref 0.7–4.0)
MCH: 30.2 pg (ref 26.0–34.0)
MCHC: 33 g/dL (ref 30.0–36.0)
MCV: 91.4 fL (ref 80.0–100.0)
Monocytes Absolute: 1.4 10*3/uL — ABNORMAL HIGH (ref 0.1–1.0)
Monocytes Relative: 6 %
Neutro Abs: 15.4 10*3/uL — ABNORMAL HIGH (ref 1.7–7.7)
Neutrophils Relative %: 71 %
Platelets: 402 10*3/uL — ABNORMAL HIGH (ref 150–400)
RBC: 4.77 MIL/uL (ref 3.87–5.11)
RDW: 15.5 % (ref 11.5–15.5)
WBC: 21.7 10*3/uL — ABNORMAL HIGH (ref 4.0–10.5)
nRBC: 0 % (ref 0.0–0.2)

## 2021-02-03 LAB — RAPID INFLUENZA A&B ANTIGENS
Influenza A (ARMC): NEGATIVE
Influenza B (ARMC): NEGATIVE

## 2021-02-03 LAB — CK: Total CK: 226 U/L (ref 38–234)

## 2021-02-03 NOTE — ED Triage Notes (Signed)
Pt reports having cramps and muscles spasms in bil calf. Also reports having muscles spasms in back that began yesterday. Today she had a spasm in lower abd.

## 2021-02-03 NOTE — Discharge Instructions (Addendum)
Your white blood cell count if very elevated. Since you have muscle cramps and spasms associated with this, this could be due to viral process.   I would advise either going to ED for further workup to rule out other causes of infection/illness if you are feeling poorly or monitoring yourself at home and increasing fluids. You can follow up with PCP or our clinic in 1-2 days for re-check of WBCs. And go to ED for any fever, increased fatigue or weakness, worsening abdominal pain, chest pain, breathing trouble or any other acute worsening of symptoms.

## 2021-02-03 NOTE — ED Provider Notes (Signed)
[facility]    CSN: [id number] Arrival date & time: 02/03/21  1900      History   Chief Complaint Chief Complaint  Patient presents with  . Spasms    HPI Destiny Bryant is a 35 y.o. female presenting for myalgias and muscle spasms since yesterday. She says that it started out with cramping in her calves and upper back. She says that today she has had continued cramping and "spasms" of the calves, back and now of her upper abdomen. These areas are tender to palpation.  Denies any injuries.  Patient has not recently overexerted herself and says that she has been more sedentary than normal.  Patient denies any similar problems in the past.  She does take a diuretic, HCTZ, but says she does not take it regularly.  Patient says that she drinks a lot of liquid but not always water.  She denies any associated fever, fatigue, cough, congestion, sore throat, headaches, chest pain, breathing difficulty, nausea/vomiting or diarrhea.  No sick contacts.  Past medical history significant for hypertension and sleep apnea.  Patient does state that she is awaiting a new sleep apnea machine. Patient not taking any medications for symptoms, no muscle relaxers. No other concerns.  HPI  Past Medical History:  Diagnosis Date  . Hypertension   . Sleep apnea     There are no problems to display for this patient.   Past Surgical History:  Procedure Laterality Date  . ROTATOR CUFF REPAIR      OB History   No obstetric history on file.      Home Medications    Prior to Admission medications   Medication Sig Start Date End Date Taking? Authorizing Provider  albuterol (VENTOLIN HFA) 108 (90 Base) MCG/ACT inhaler Inhale 2 puffs into the lungs every 4 (four) hours as needed. 12/27/20   Becky Augusta, NP  benzonatate (TESSALON) 100 MG capsule Take 2 capsules (200 mg total) by mouth every 8 (eight) hours. 12/27/20   Becky Augusta, NP  hydrochlorothiazide (HYDRODIURIL) 25 MG tablet Take 25 mg by  mouth daily. 07/14/20   [provider]  predniSONE (STERAPRED UNI-PAK 21 TAB) 10 MG (21) TBPK tablet Take by mouth daily. Take 6 tabs by mouth daily  for 2 days, then 5 tabs for 2 days, then 4 tabs for 2 days, then 3 tabs for 2 days, 2 tabs for 2 days, then 1 tab by mouth daily for 2 days 12/27/20   Becky Augusta, NP  sertraline (ZOLOFT) 100 MG tablet Take 100 mg by mouth daily. 07/10/20   [provider]  Spacer/Aero-Holding Chambers (AEROCHAMBER MV) inhaler Use as instructed 12/27/20   Becky Augusta, NP    Family History Family History  Problem Relation Age of Onset  . Hypertension Mother   . Hypertension Father     Social History Social History   Tobacco Use  . Smoking status: Current Some Day Smoker  . Smokeless tobacco: Never Used  . Tobacco comment: 1 cig every 2-3 days  Vaping Use  . Vaping Use: Never used  Substance Use Topics  . Alcohol use: Yes    Comment: social  . Drug use: Never     Allergies   Patient has no known allergies.   Review of Systems Review of Systems  Constitutional: Negative for fatigue and fever.  Respiratory: Negative for cough and shortness of breath.   Cardiovascular: Negative for chest pain, palpitations and leg swelling.  Gastrointestinal: Positive for abdominal pain. Negative  for constipation, diarrhea, nausea and vomiting.  Endocrine: Negative for polydipsia and polyuria.  Musculoskeletal: Positive for back pain and myalgias.  Skin: Negative for color change and rash.  Neurological: Negative for weakness and numbness.     Physical Exam Triage Vital Signs ED Triage Vitals  Enc Vitals Group     BP 02/03/21 1912 (!) 152/109     Pulse Rate 02/03/21 1912 (!) 106     Resp 02/03/21 1912 18     Temp 02/03/21 1912 98.6 F (37 C)     Temp Source 02/03/21 1912 Oral     SpO2 02/03/21 1912 99 %     Weight 02/03/21 1913 222 lb (100.7 kg)     Height 02/03/21 1913 5\' 1"  (1.549 m)     Head Circumference --      Peak Flow --       Pain Score 02/03/21 1913 8     Pain Loc --      Pain Edu? --      Excl. in GC? --    No data found.  Updated Vital Signs BP (!) 152/109 Comment: did not take BP meds today  Pulse (!) 106   Temp 98.6 F (37 C) (Oral)   Resp 18   Ht 5\' 1"  (1.549 m)   Wt 222 lb (100.7 kg)   SpO2 99%   BMI 41.95 kg/m       Physical Exam Vitals and nursing note reviewed.  Constitutional:      General: She is not in acute distress.    Appearance: Normal appearance. She is not ill-appearing or toxic-appearing.  HENT:     Head: Normocephalic and atraumatic.     Nose: Nose normal.     Mouth/Throat:     Mouth: Mucous membranes are moist.     Pharynx: Oropharynx is clear.  Eyes:     General: No scleral icterus.       Right eye: No discharge.        Left eye: No discharge.     Conjunctiva/sclera: Conjunctivae normal.  Cardiovascular:     Rate and Rhythm: Normal rate and regular rhythm.     Heart sounds: Normal heart sounds.  Pulmonary:     Effort: Pulmonary effort is normal. No respiratory distress.     Breath sounds: Normal breath sounds.  Abdominal:     Palpations: Abdomen is soft.     Tenderness: There is abdominal tenderness in the right upper quadrant and left upper quadrant. There is no right CVA tenderness, left CVA tenderness, guarding or rebound.  Musculoskeletal:     Cervical back: Neck supple.     Comments: Tenderness to palpation bilateral calves. No swelling, erythema, ecchymosis  Tenderness of bilateral thoracic paravertebral muscles. Good ROM of back.   Skin:    General: Skin is dry.  Neurological:     General: No focal deficit present.     Mental Status: She is alert. Mental status is at baseline.     Motor: No weakness.     Gait: Gait normal.  Psychiatric:        Mood and Affect: Mood normal.        Behavior: Behavior normal.        Thought Content: Thought content normal.      UC Treatments / Results  Labs (all labs ordered are listed, but only abnormal  results are displayed) Labs Reviewed  URINALYSIS, COMPLETE (UACMP) WITH MICROSCOPIC - Abnormal; Notable for the following components:  Result Value   Hgb urine dipstick SMALL (*)    Protein, ur 30 (*)    Bacteria, UA FEW (*)    All other components within normal limits  CBC WITH DIFFERENTIAL/PLATELET - Abnormal; Notable for the following components:   WBC 21.7 (*)    Platelets 402 (*)    Neutro Abs 15.4 (*)    Lymphs Abs 4.7 (*)    Monocytes Absolute 1.4 (*)    Abs Immature Granulocytes 0.19 (*)    All other components within normal limits  COMPREHENSIVE METABOLIC PANEL - Abnormal; Notable for the following components:   Sodium 134 (*)    CO2 21 (*)    Total Protein 8.5 (*)    AST 54 (*)    ALT 63 (*)    All other components within normal limits  RAPID INFLUENZA A&B ANTIGENS  CK    EKG   Radiology No results found.  Procedures Procedures (including critical care time)  Medications Ordered in UC Medications - No data to display  Initial Impression / Assessment and Plan / UC Course  I have reviewed the triage vital signs and the nursing notes.  Pertinent labs & imaging results that were available during my care of the patient were reviewed by me and considered in my medical decision making (see chart for details).   35 y/o female presenting for symmetric muscle spasms of calves, upper back, and upper abdomen since yesterday. No fever, weakness, URI symptoms, GI symptoms, urinary symptoms, CP or SOB.   BP elevated at 152/109 and HR elevated to 106 bpm. Other VSS. Exam revealing for well-appearing female with tenderness to bilateral calves, muscles of bilateral upper back, and right upper quadrant as well as left upper quadrant.  Chest is clear to auscultation and tachycardic rate.  CBC shows elevated white blood cells at 21.7 with left shift, elevated platelets at 402. CMP shows elevated LFTs teas.  AST is 54 and ALT is 63. UA shows small blood and protein. CK is  226, within normal limits. Rapid flu test obtained today--negative.   Given elevated WBCs, body aches and slightly elevated LFTs, suspect viral illness most likely and advised patient of close monitoring or going to ED for further workup which would likely include imaging. Patient has elected to go home and rest and increase fluid intake.  She will take Tylenol and Motrin for the body aches.  Patient advised of ED red flag signs and symptoms and advised her to go there if any of her symptoms worsen or she develops any significant abdominal pain, fever, weakness, chest pain, breathing difficulty, excessive or intractable vomiting, severe headache.  Otherwise, patient advised to follow-up with PCP office or our clinic in the next 1 to 2 days for recheck of her CBC and/or other work-up if needed.   Final Clinical Impressions(s) / UC Diagnoses   Final diagnoses:  Myalgia  Leukocytosis, unspecified type     Discharge Instructions     Your white blood cell count if very elevated. Since you have muscle cramps and spasms associated with this, this could be due to viral process.   I would advise either going to ED for further workup to rule out other causes of infection/illness if you are feeling poorly or monitoring yourself at home and increasing fluids. You can follow up with PCP or our clinic in 1-2 days for re-check of WBCs. And go to ED for any fever, increased fatigue or weakness, worsening abdominal pain, chest pain, breathing  trouble or any other acute worsening of symptoms.     ED Prescriptions    None     PDMP not reviewed this encounter.   Shirlee Latch, PA-C 02/04/21 [phone]

## 2021-04-02 ENCOUNTER — Other Ambulatory Visit: Payer: Self-pay | Admitting: Orthopedic Surgery

## 2021-04-02 DIAGNOSIS — Z9889 Other specified postprocedural states: Secondary | ICD-10-CM

## 2021-04-10 ENCOUNTER — Ambulatory Visit (INDEPENDENT_AMBULATORY_CARE_PROVIDER_SITE_OTHER): Payer: Medicaid Other

## 2021-04-10 ENCOUNTER — Other Ambulatory Visit: Payer: Self-pay

## 2021-04-10 ENCOUNTER — Ambulatory Visit
Admission: EM | Admit: 2021-04-10 | Discharge: 2021-04-10 | Disposition: A | Discharge status: Home / Self Care | Payer: Medicaid Other | Attending: Sports Medicine | Admitting: Sports Medicine

## 2021-04-10 DIAGNOSIS — S62524A Nondisplaced fracture of distal phalanx of right thumb, initial encounter for closed fracture: Secondary | ICD-10-CM

## 2021-04-10 DIAGNOSIS — M79644 Pain in right finger(s): Secondary | ICD-10-CM | POA: Diagnosis not present

## 2021-04-10 MED ORDER — MELOXICAM 15 MG PO TABS: 15.0000 mg | ORAL_TABLET | Freq: Every day | ORAL | 0 refills | Status: AC

## 2021-04-10 MED ORDER — HYDROCODONE-ACETAMINOPHEN 5-325 MG PO TABS: 2.0000 | ORAL_TABLET | Freq: Three times a day (TID) | ORAL | 0 refills | Status: DC | PRN

## 2021-04-10 NOTE — Discharge Instructions (Addendum)
FRACTURE UPPER EXTREMITY: You have had imaging today which indicated a fracture. You will be referred to ortho for further management. You have been advised not to use the affected finger/wrist/arm/shoulder until cleared by orthopedics. You should wear the splint that was applied today. Practice RICE guidelines and take NSAIDs and/or Tylenol for pain relief   You have a condition requiring you to follow up with Orthopedics so please call one of the following office for appointment:   [facility] [address], [place], [place] [address] Phone: [phone]  [facility] [address], [place], [place] [address] Phone: [phone]  

## 2021-04-10 NOTE — ED Provider Notes (Signed)
[facility]    CSN: [id number] Arrival date & time: 04/10/21  1520      History   Chief Complaint Chief Complaint  Patient presents with  . Finger Injury    Right thumb    HPI Destiny Bryant is a 35 y.o. female presenting for right distal thumb pain following a injury that occurred last night.  Patient says she excellently slammed in her door at her home.  She says it has been a constant throbbing severe pain since.  She admits to bruising and swelling.  She has tried ibuprofen and Tylenol without relief.  She says she is also iced it a little.  Patient concerned she may have broken her thumb.  She is right-handed.  She says the tip does have a little bit of numbness.  Denies any weakness.  Admits to increased pain when trying to grip.  No other injuries or complaints.  HPI  Past Medical History:  Diagnosis Date  . Hypertension   . Sleep apnea     There are no problems to display for this patient.   Past Surgical History:  Procedure Laterality Date  . ROTATOR CUFF REPAIR      OB History   No obstetric history on file.      Home Medications    Prior to Admission medications   Medication Sig Start Date End Date Taking? Authorizing Provider  hydrochlorothiazide (HYDRODIURIL) 25 MG tablet Take 25 mg by mouth daily. 07/14/20  Yes [provider]  HYDROcodone-acetaminophen (NORCO/VICODIN) 5-325 MG tablet Take 2 tablets by mouth every 8 (eight) hours as needed for severe pain. 04/10/21  Yes Shirlee Latch, PA-C  meloxicam (MOBIC) 15 MG tablet Take 1 tablet (15 mg total) by mouth daily for 15 days. 04/10/21 04/25/21 Yes Eusebio Friendly B, PA-C  sertraline (ZOLOFT) 100 MG tablet Take 100 mg by mouth daily. 07/10/20  Yes [provider]  albuterol (VENTOLIN HFA) 108 (90 Base) MCG/ACT inhaler Inhale 2 puffs into the lungs every 4 (four) hours as needed. 12/27/20   Becky Augusta, NP  benzonatate (TESSALON) 100 MG capsule Take 2 capsules (200 mg total) by  mouth every 8 (eight) hours. 12/27/20   Becky Augusta, NP  predniSONE (STERAPRED UNI-PAK 21 TAB) 10 MG (21) TBPK tablet Take by mouth daily. Take 6 tabs by mouth daily  for 2 days, then 5 tabs for 2 days, then 4 tabs for 2 days, then 3 tabs for 2 days, 2 tabs for 2 days, then 1 tab by mouth daily for 2 days 12/27/20   Becky Augusta, NP  Spacer/Aero-Holding Chambers (AEROCHAMBER MV) inhaler Use as instructed 12/27/20   Becky Augusta, NP    Family History Family History  Problem Relation Age of Onset  . Hypertension Mother   . Hypertension Father     Social History Social History   Tobacco Use  . Smoking status: Current Some Day Smoker  . Smokeless tobacco: Never Used  . Tobacco comment: 1 cig every 2-3 days  Vaping Use  . Vaping Use: Never used  Substance Use Topics  . Alcohol use: Yes    Comment: social  . Drug use: Never     Allergies   Patient has no known allergies.   Review of Systems Review of Systems  Musculoskeletal: Positive for arthralgias and joint swelling.  Skin: Positive for color change. Negative for wound.  Neurological: Negative for weakness and numbness.     Physical Exam Triage Vital Signs ED Triage Vitals  Enc Vitals Group     BP 04/10/21 1543 (!) 130/102     Pulse Rate 04/10/21 1543 94     Resp 04/10/21 1543 18     Temp 04/10/21 1543 98.8 F (37.1 C)     Temp Source 04/10/21 1543 Oral     SpO2 04/10/21 1543 100 %     Weight 04/10/21 1542 218 lb (98.9 kg)     Height 04/10/21 1542 5\' 1"  (1.549 m)     Head Circumference --      Peak Flow --      Pain Score 04/10/21 1542 10     Pain Loc --      Pain Edu? --      Excl. in GC? --    No data found.  Updated Vital Signs BP (!) 130/102 (BP Location: Left Arm)   Pulse 94   Temp 98.8 F (37.1 C) (Oral)   Resp 18   Ht 5\' 1"  (1.549 m)   Wt 218 lb (98.9 kg)   LMP 04/03/2021   SpO2 100%   BMI 41.19 kg/m       Physical Exam Vitals and nursing note reviewed.  Constitutional:      General:  She is not in acute distress.    Appearance: Normal appearance. She is not ill-appearing or toxic-appearing.  HENT:     Head: Normocephalic and atraumatic.  Eyes:     General: No scleral icterus.       Right eye: No discharge.        Left eye: No discharge.     Conjunctiva/sclera: Conjunctivae normal.  Cardiovascular:     Rate and Rhythm: Normal rate.     Pulses: Normal pulses.  Pulmonary:     Effort: Pulmonary effort is normal. No respiratory distress.  Musculoskeletal:     Right hand: Swelling (moderate swelling and ecchymosis distal thumb. No subungual hematoma) and bony tenderness (TTP distal phalanx of thumb and DIP joint) present. Decreased range of motion (at DIP of thumb). Normal capillary refill. Normal pulse.     Cervical back: Neck supple.  Skin:    General: Skin is dry.  Neurological:     General: No focal deficit present.     Mental Status: She is alert. Mental status is at baseline.     Motor: No weakness.     Gait: Gait normal.  Psychiatric:        Mood and Affect: Mood normal.        Behavior: Behavior normal.        Thought Content: Thought content normal.      UC Treatments / Results  Labs (all labs ordered are listed, but only abnormal results are displayed) Labs Reviewed - No data to display  EKG   Radiology DG Finger Thumb Right  Result Date: 04/10/2021 CLINICAL DATA:  35 year old female with trauma to the right thumb. EXAM: RIGHT THUMB 2+V COMPARISON:  Right hand radiograph dated 08/23/2019. FINDINGS: There is a nondisplaced transverse fracture of the distal phalanx of the thumb. No other acute fracture identified. There is no dislocation. The soft tissues are grossly unremarkable. IMPRESSION: Nondisplaced fracture of the distal phalanx of the thumb. Electronically Signed   By: 20 M.D.   On: 04/10/2021 15:56    Procedures Procedures (including critical care time)  Medications Ordered in UC Medications - No data to  display  Initial Impression / Assessment and Plan / UC Course  I have reviewed the triage vital signs  and the nursing notes.  Pertinent labs & imaging results that were available during my care of the patient were reviewed by me and considered in my medical decision making (see chart for details).   35 year old female presenting for right thumb crushing injury that occurred last night.  X-ray obtained today which does reveal a nondisplaced fracture of the distal phalanx of the thumb.  I did independently review the x-ray.  I reviewed the x-ray results with patient.  Patient placed in thumb spica splint.  Advised RICE.  Sent meloxicam.  Advised to try Tylenol thoughts on doing it.  Short supply of hydrocodone given for bedtime use.  Advised not to use affected extremity until cleared by Ortho.  Advised to follow-up with orthopedics later this week.  ED precautions reviewed.   Final Clinical Impressions(s) / UC Diagnoses   Final diagnoses:  Closed nondisplaced fracture of distal phalanx of right thumb, initial encounter     Discharge Instructions     FRACTURE UPPER EXTREMITY: You have had imaging today which indicated a fracture. You will be referred to ortho for further management. You have been advised not to use the affected finger/wrist/arm/shoulder until cleared by orthopedics. You should wear the splint that was applied today. Practice RICE guidelines and take NSAIDs and/or Tylenol for pain relief  You have a condition requiring you to follow up with Orthopedics so please call one of the following office for appointment:   [facility] [address] [place], [place] [address] Phone: [phone]  [facility] [address] [place], [place] [address] Phone: [phone]     ED Prescriptions    Medication Sig Dispense Auth. Provider   meloxicam (MOBIC) 15 MG tablet Take 1 tablet (15 mg total) by mouth daily for 15 days. 15 tablet Eusebio Friendly B, PA-C    HYDROcodone-acetaminophen (NORCO/VICODIN) 5-325 MG tablet Take 2 tablets by mouth every 8 (eight) hours as needed for severe pain. 6 tablet Shirlee Latch, PA-C     I have reviewed the PDMP during this encounter.   Shirlee Latch, PA-C 04/11/21 [phone]

## 2021-04-10 NOTE — ED Triage Notes (Signed)
Patient states that she slammed her right thumb in the door to her house last night and has been throbbing since.

## 2021-04-20 ENCOUNTER — Ambulatory Visit: Admission: RE | Admit: 2021-04-20 | Payer: Medicaid Other | Source: Ambulatory Visit

## 2021-04-20 ENCOUNTER — Ambulatory Visit: Payer: Medicaid Other

## 2021-05-04 ENCOUNTER — Ambulatory Visit: Payer: Medicaid Other

## 2021-05-06 ENCOUNTER — Ambulatory Visit
Admission: RE | Admit: 2021-05-06 | Discharge: 2021-05-06 | Disposition: A | Discharge status: Home / Self Care | Payer: Medicaid Other | Source: Ambulatory Visit | Attending: Orthopedic Surgery | Admitting: Orthopedic Surgery

## 2021-05-06 ENCOUNTER — Other Ambulatory Visit: Payer: Self-pay

## 2021-05-06 DIAGNOSIS — M19011 Primary osteoarthritis, right shoulder: Secondary | ICD-10-CM | POA: Diagnosis not present

## 2021-05-06 DIAGNOSIS — Z9889 Other specified postprocedural states: Secondary | ICD-10-CM

## 2021-05-06 DIAGNOSIS — M25511 Pain in right shoulder: Secondary | ICD-10-CM | POA: Diagnosis present

## 2021-05-06 MED ORDER — SODIUM CHLORIDE (PF) 0.9 % IJ SOLN
10.0000 mL | INTRAMUSCULAR | Status: DC | PRN
  Administered 2021-05-06: 5 mL via INTRAVENOUS

## 2021-05-06 MED ORDER — LIDOCAINE HCL (PF) 1 % IJ SOLN
5.0000 mL | Freq: Once | INTRAMUSCULAR | Status: AC
  Administered 2021-05-06: 5 mL
  Filled 2021-05-06: qty 5

## 2021-05-06 MED ORDER — GADOBUTROL 1 MMOL/ML IV SOLN
2.0000 mL | Freq: Once | INTRAVENOUS | Status: AC | PRN
  Administered 2021-05-06: 0.5 mL

## 2021-05-06 MED ORDER — IOHEXOL 180 MG/ML  SOLN
20.0000 mL | Freq: Once | INTRAMUSCULAR | Status: AC
  Administered 2021-05-06: 15 mL

## 2021-08-23 ENCOUNTER — Other Ambulatory Visit: Payer: Self-pay | Admitting: Orthopedic Surgery

## 2021-09-10 ENCOUNTER — Other Ambulatory Visit: Payer: Self-pay

## 2021-09-10 ENCOUNTER — Ambulatory Visit: Payer: Medicaid Other | Admitting: Anesthesiology

## 2021-09-10 ENCOUNTER — Ambulatory Visit
Admission: RE | Admit: 2021-09-10 | Discharge: 2021-09-10 | Disposition: A | Discharge status: Home / Self Care | Payer: Medicaid Other | Attending: Orthopedic Surgery | Admitting: Orthopedic Surgery

## 2021-09-10 ENCOUNTER — Encounter
Admission: RE | Disposition: A | Discharge status: Home / Self Care | Payer: Self-pay | Source: Home / Self Care | Attending: Orthopedic Surgery

## 2021-09-10 ENCOUNTER — Encounter: Payer: Self-pay | Admitting: Orthopedic Surgery

## 2021-09-10 DIAGNOSIS — M7551 Bursitis of right shoulder: Secondary | ICD-10-CM | POA: Insufficient documentation

## 2021-09-10 DIAGNOSIS — S43431A Superior glenoid labrum lesion of right shoulder, initial encounter: Secondary | ICD-10-CM | POA: Diagnosis not present

## 2021-09-10 DIAGNOSIS — M19011 Primary osteoarthritis, right shoulder: Secondary | ICD-10-CM | POA: Diagnosis not present

## 2021-09-10 DIAGNOSIS — Z79899 Other long term (current) drug therapy: Secondary | ICD-10-CM | POA: Insufficient documentation

## 2021-09-10 DIAGNOSIS — X58XXXA Exposure to other specified factors, initial encounter: Secondary | ICD-10-CM | POA: Insufficient documentation

## 2021-09-10 HISTORY — PX: SHOULDER ARTHROSCOPY WITH BICEPS TENDON REPAIR: SHX5674

## 2021-09-10 LAB — POCT PREGNANCY, URINE: Preg Test, Ur: NEGATIVE

## 2021-09-10 SURGERY — SHOULDER ARTHROSCOPY WITH BICEPS TENDON REPAIR
Anesthesia: General | Site: Shoulder | Laterality: Right

## 2021-09-10 MED ORDER — IBUPROFEN 800 MG PO TABS: 800.0000 mg | ORAL_TABLET | Freq: Three times a day (TID) | ORAL | 1 refills | Status: AC

## 2021-09-10 MED ORDER — OXYCODONE HCL 5 MG PO TABS: 5.0000 mg | ORAL_TABLET | Freq: Once | ORAL | Status: DC | PRN

## 2021-09-10 MED ORDER — PROPOFOL 10 MG/ML IV BOLUS
INTRAVENOUS | Status: DC | PRN
  Administered 2021-09-10: 150 mg via INTRAVENOUS

## 2021-09-10 MED ORDER — LACTATED RINGERS IV SOLN: INTRAVENOUS | Status: DC

## 2021-09-10 MED ORDER — LACTATED RINGERS IV SOLN
INTRAVENOUS | Status: DC | PRN
  Administered 2021-09-10: 12000 mL

## 2021-09-10 MED ORDER — FENTANYL CITRATE (PF) 100 MCG/2ML IJ SOLN
INTRAMUSCULAR | Status: DC | PRN
  Administered 2021-09-10: 100 ug via INTRAVENOUS

## 2021-09-10 MED ORDER — BUPIVACAINE LIPOSOME 1.3 % IJ SUSP
INTRAMUSCULAR | Status: DC | PRN
  Administered 2021-09-10: 20 mL via PERINEURAL

## 2021-09-10 MED ORDER — ACETAMINOPHEN 160 MG/5ML PO SOLN: 325.0000 mg | ORAL | Status: DC | PRN

## 2021-09-10 MED ORDER — ACETAMINOPHEN 500 MG PO TABS: 1000.0000 mg | ORAL_TABLET | Freq: Three times a day (TID) | ORAL | 2 refills | Status: DC

## 2021-09-10 MED ORDER — DEXAMETHASONE SODIUM PHOSPHATE 4 MG/ML IJ SOLN
INTRAMUSCULAR | Status: DC | PRN
  Administered 2021-09-10: 4 mg via INTRAVENOUS

## 2021-09-10 MED ORDER — MIDAZOLAM HCL 2 MG/2ML IJ SOLN
INTRAMUSCULAR | Status: DC | PRN
  Administered 2021-09-10: 2 mg via INTRAVENOUS

## 2021-09-10 MED ORDER — CEFAZOLIN SODIUM-DEXTROSE 2-4 GM/100ML-% IV SOLN
2.0000 g | INTRAVENOUS | Status: AC
  Administered 2021-09-10: 2 g via INTRAVENOUS

## 2021-09-10 MED ORDER — FENTANYL CITRATE PF 50 MCG/ML IJ SOSY: 25.0000 ug | PREFILLED_SYRINGE | INTRAMUSCULAR | Status: DC | PRN

## 2021-09-10 MED ORDER — OXYCODONE HCL 5 MG PO TABS: 5.0000 mg | ORAL_TABLET | ORAL | 0 refills | Status: DC | PRN

## 2021-09-10 MED ORDER — ACETAMINOPHEN 325 MG PO TABS: 325.0000 mg | ORAL_TABLET | ORAL | Status: DC | PRN

## 2021-09-10 MED ORDER — ASPIRIN EC 325 MG PO TBEC: 325.0000 mg | DELAYED_RELEASE_TABLET | Freq: Every day | ORAL | 0 refills | Status: AC

## 2021-09-10 MED ORDER — ONDANSETRON HCL 4 MG/2ML IJ SOLN
4.0000 mg | Freq: Once | INTRAMUSCULAR | Status: AC | PRN
  Administered 2021-09-10: 4 mg via INTRAVENOUS

## 2021-09-10 MED ORDER — BUPIVACAINE HCL (PF) 0.5 % IJ SOLN
INTRAMUSCULAR | Status: DC | PRN
  Administered 2021-09-10: 20 mL via PERINEURAL

## 2021-09-10 MED ORDER — OXYCODONE HCL 5 MG/5ML PO SOLN: 5.0000 mg | Freq: Once | ORAL | Status: DC | PRN

## 2021-09-10 MED ORDER — ONDANSETRON 4 MG PO TBDP: 4.0000 mg | ORAL_TABLET | Freq: Three times a day (TID) | ORAL | 0 refills | Status: DC | PRN

## 2021-09-10 MED ORDER — GLYCOPYRROLATE 0.2 MG/ML IJ SOLN
INTRAMUSCULAR | Status: DC | PRN
  Administered 2021-09-10: .1 mg via INTRAVENOUS

## 2021-09-10 MED ORDER — SCOPOLAMINE 1 MG/3DAYS TD PT72
1.0000 | MEDICATED_PATCH | Freq: Once | TRANSDERMAL | Status: DC
  Administered 2021-09-10: 1.5 mg via TRANSDERMAL

## 2021-09-10 SURGICAL SUPPLY — 41 items
ADAPTER IRRIG TUBE 2 SPIKE SOL (ADAPTER) ×4 IMPLANT
ADPR TBG 2 SPK PMP STRL ASCP (ADAPTER) ×2
ANCH SUT 2.9 PUSHLOCK ANCH (Orthopedic Implant) ×1 IMPLANT
APL PRP STRL LF DISP 70% ISPRP (MISCELLANEOUS) ×1
BLADE FULL RADIUS 3.5 (BLADE) ×2 IMPLANT
BLADE SHAVER 4.5X7 STR FR (MISCELLANEOUS) ×1 IMPLANT
BUR BR 5.5 WIDE MOUTH (BURR) ×2 IMPLANT
BUR RADIUS 4.0X18.5 (BURR) ×2 IMPLANT
CHLORAPREP W/TINT 26 (MISCELLANEOUS) ×2 IMPLANT
COOLER POLAR GLACIER W/PUMP (MISCELLANEOUS) ×2 IMPLANT
COVER LIGHT HANDLE UNIVERSAL (MISCELLANEOUS) ×4 IMPLANT
DRAPE IMP U-DRAPE 54X76 (DRAPES) ×4 IMPLANT
DRAPE INCISE IOBAN 66X45 STRL (DRAPES) ×2 IMPLANT
DRAPE U-SHAPE 48X52 POLY STRL (PACKS) ×2 IMPLANT
DRSG TEGADERM 4X4.75 (GAUZE/BANDAGES/DRESSINGS) ×6 IMPLANT
GAUZE XEROFORM 1X8 LF (GAUZE/BANDAGES/DRESSINGS) ×2 IMPLANT
GLOVE SURG ENC MOIS LTX SZ7.5 (GLOVE) ×4 IMPLANT
GOWN STRL REIN 2XL XLG LVL4 (GOWN DISPOSABLE) ×2 IMPLANT
GOWN STRL REUS W/ TWL LRG LVL3 (GOWN DISPOSABLE) ×2 IMPLANT
GOWN STRL REUS W/TWL LRG LVL3 (GOWN DISPOSABLE) ×4
IV LACTATED RINGER IRRG 3000ML (IV SOLUTION) ×12
IV LR IRRIG 3000ML ARTHROMATIC (IV SOLUTION) ×6 IMPLANT
KIT STABILIZATION SHOULDER (MISCELLANEOUS) ×2 IMPLANT
KIT TURNOVER KIT A (KITS) ×2 IMPLANT
MANIFOLD NEPTUNE II (INSTRUMENTS) ×2 IMPLANT
MASK FACE SPIDER DISP (MASK) ×2 IMPLANT
MAT GRAY ABSORB FLUID 28X50 (MISCELLANEOUS) ×4 IMPLANT
PACK ARTHROSCOPY SHOULDER (MISCELLANEOUS) ×2 IMPLANT
PAD WRAPON POLAR SHDR XLG (MISCELLANEOUS) ×1 IMPLANT
SLING ARM LRG DEEP (SOFTGOODS) IMPLANT
SLING ARM M TX990204 (SOFTGOODS) ×1 IMPLANT
SPONGE GAUZE 2X2 8PLY STRL LF (GAUZE/BANDAGES/DRESSINGS) ×8 IMPLANT
SUT ETHILON 3-0 FS-10 30 BLK (SUTURE) ×2
SUTURE EHLN 3-0 FS-10 30 BLK (SUTURE) ×1 IMPLANT
SYR 10ML LL (SYRINGE) ×2 IMPLANT
SYSTEM IMPL TENODESIS LNT 2.9 (Orthopedic Implant) ×1 IMPLANT
TAPE MICROFOAM 4IN (TAPE) ×2 IMPLANT
TUBING INFLOW SET DBFLO PUMP (TUBING) ×2 IMPLANT
TUBING OUTFLOW SET DBLFO PUMP (TUBING) ×2 IMPLANT
WAND WEREWOLF FLOW 90D (MISCELLANEOUS) ×2 IMPLANT
WRAPON POLAR PAD SHDR XLG (MISCELLANEOUS) ×2

## 2021-09-10 NOTE — Discharge Instructions (Signed)
Post-Op Instructions - Arthroscopic Shoulder Surgery with Biceps Tenodesis  1. Bracing: You will wear a shoulder immobilizer or sling for 1-2 weeks as needed for comfort. You may wean from sling as able.   2. Driving: When driving, do not wear the immobilizer. Ideally, we recommend no driving while sling is in place as one arm will be immobilized. No driving if taking narcotic pain medications.  3. Activity: Progress to motion as tolerated, moving from passive to active-assisted to active motion. Return to normal activities normally takes 3-4 months on average. If rehab goes very well, may be able to do most activities at 2 months, except overhead or contact sports.  4. Physical Therapy: Begins 3-4 days after surgery, and proceed 1 time per week for the first 6 weeks, then 1-2 times per week from weeks 6-12 post-op.  5. Medications:  - Take tylenol 1000 mg (2 Extra Strength tablets or 3 regular strength) every 8 hours for pain.  May decrease or stop tylenol 5 days after surgery if you are having minimal pain. - Take ASA 325mg /day x 2 weeks to help prevent DVTs/PEs (blood clots).  - Take 800mg  ibuprofen 3x/daily with food until off narcotic medications.  - You will be provided a prescription for narcotic pain medicine. After surgery, take 1-2 narcotic tablets every 4 hours if needed for severe pain.  - A prescription for anti-nausea medication will be provided in case the narcotic medicine causes nausea - take 1 tablet every 6 hours only if nauseated.    If you are taking prescription medication for anxiety, depression, insomnia, muscle spasm, chronic pain, or for attention deficit disorder, you are advised that you are at a higher risk of adverse effects with use of narcotics post-op, including narcotic addiction/dependence, depressed breathing, death. If you use non-prescribed substances: alcohol, marijuana, cocaine, heroin, methamphetamines, etc., you are at a higher risk of adverse effects with  use of narcotics post-op, including narcotic addiction/dependence, depressed breathing, death. You are advised that taking > 50 morphine milligram equivalents (MME) of narcotic pain medication per day results in twice the risk of overdose or death. For your prescription provided: oxycodone 5 mg - taking more than 6 tablets per day would result in > 50 morphine milligram equivalents (MME) of narcotic pain medication. Be advised that we will prescribe narcotics short-term, for acute post-operative pain only - 3 weeks for major operations such as shoulder repair/reconstruction surgeries.    6. Post-Op Appointment:  Your first post-op appointment will be 10-14 days post-op.  7. Work or School: For most, but not all procedures, we advise staying out of work or school for at least 1 to 2 weeks in order to recover from the stress of surgery and to allow time for healing.   If you need a work or school note this can be provided.   8. Smoking: If you are a smoker, you need to refrain from smoking in the postoperative period. The nicotine in cigarettes will inhibit healing of your shoulder repair and decrease the chance of successful repair. Similarly, nicotine containing products (gum, patches) should be avoided.

## 2021-09-10 NOTE — Anesthesia Procedure Notes (Signed)
Anesthesia Regional Block: Interscalene brachial plexus block   Pre-Anesthetic Checklist: , timeout performed,  Correct Patient, Correct Site, Correct Laterality,  Correct Procedure, Correct Position, site marked,  Risks and benefits discussed,  Surgical consent,  Pre-op evaluation,  At surgeon's request and post-op pain management  Laterality: Right  Prep: chloraprep       Needles:  Injection technique: Single-shot  Needle Type: Stimiplex     Needle Length: 10cm  Needle Gauge: 21     Additional Needles:   Procedures:,,,, ultrasound used (permanent image in chart),,    Narrative:  Start time: 09/10/2021 12:08 PM End time: 09/10/2021 12:14 PM Injection made incrementally with aspirations every 5 mL.  Performed by: Personally  Anesthesiologist: Ranee Gosselin, MD  Additional Notes: Functioning IV was confirmed and monitors applied. Ultrasound guidance: relevant anatomy identified, needle position confirmed, local anesthetic spread visualized around nerve(s)., vascular puncture avoided.  Image printed for medical record.  Negative aspiration and no paresthesias; incremental administration of local anesthetic. The patient tolerated the procedure well. Vitals signes recorded in RN notes.

## 2021-09-10 NOTE — Op Note (Addendum)
SURGERY DATE: 09/10/2021  PRE-OP DIAGNOSIS:  1.  Right shoulder SLAP tear  2.  Right shoulder mild degenerative changes 3.  Right shoulder subacromial bursitis  POST-OP DIAGNOSIS: 1.  Right shoulder SLAP tear  2.  Right shoulder mild degenerative changes 3.  Right shoulder subacromial bursitis   PROCEDURES:  1.  Right shoulder arthroscopic biceps tenodesis 2.  Right shoulder arthroscopic extensive debridement of shoulder (glenohumeral and subacromial spaces)  SURGEON: Rosealee Albee, MD  ASSISTANT: Forrestine Him, PA-S   ANESTHESIA: Gen with Exparel interscalene block  ESTIMATED BLOOD LOSS: minimal  TOTAL IV FLUIDS: per anesthesia   IMPLANTS:  Arthrex 2.57mm PushLock x 1  OPERATIVE FINDINGS:  Examination under anesthesia: A careful examination under anesthesia was performed.  Passive range of motion was: FF: 150; ER at side: 50; ER in abduction: 90; IR in abduction: 50.  Anterior load shift: 1+.  Posterior load shift: 1+.  Sulcus in neutral: 1+.  Sulcus in ER: NT.    Intra-operative findings: A thorough arthroscopic examination of the shoulder was performed.  The findings are: 1. Biceps tendon: Mild tendinopathy intra-articularly 2. Superior labrum: Type II SLAP tear 3. Posterior labrum and capsule: normal 4. Inferior capsule and inferior recess: normal 5. Glenoid cartilage surface: Significant areas of grade 3 degenerative changes to the anterior glenoid 6. Supraspinatus attachment: normal 7. Posterior rotator cuff attachment: normal 8. Humeral head articular cartilage: Areas of grade 2-3 degenerative changes to the anterior humeral head 9. Rotator interval: significant synovitis 10: Subscapularis tendon: attachment intact, prior repair suture visualized 11. Anterior labrum: Intact with evidence of prior repair 12. IGHL: normal  OPERATIVE REPORT:   Indications for procedure: Destiny Bryant is a 35 y.o. female with almost 2 years of right shoulder pain.  She has a history  of an open [employer] repair due to recurrent dislocations in 2013.  She did well after that surgery until recently.  She had significant pain over the anterior aspect of the shoulder.  Clinical exam and imaging studies were consistent with diagnosis of biceps tendon pathology, SLAP tear, and early degenerative changes.  She had failed extensive nonoperative management. After discussion of risks, benefits, and alternatives to surgery, the patient elected to proceed.     Procedure in detail:  I identified the patient in the pre-operative holding area.  I marked the operative shoulder with my initials. I reviewed the risks and benefits of the proposed surgical intervention, and the patient (and/or patient's guardian) wished to proceed.  Anesthesia was then performed with an interscalene block with Exparel.  The patient was transferred to the operative suite and placed in the beach chair position.    SCDs were placed on the lower extremities. Appropriate IV antibiotics were administered prior to incision. The operative upper extremity was then prepped and draped in standard fashion. A time out was performed confirming the correct extremity, correct patient, and correct procedure.   I then created a standard posterior portal with an 11 blade. The glenohumeral joint was easily entered with a blunt trochar and the arthroscope introduced. The findings of diagnostic arthroscopy are described above. I debrided degenerative tissue including the synovitic tissue about the rotator interval, the superior labrum, the cartilage about the humeral head, and the cartilage of the glenoid. I then coagulated the inflamed synovium to obtain hemostasis and reduce the risk of post-operative swelling using an Arthrocare radiofrequency device.  I then turned my attention to the arthroscopic biceps tenodesis. The Loop n Tack technique was used to pass  a FiberTape through the biceps in a locked fashion adjacent to the biceps anchor.  A  hole for a 2.9 mm Arthrex PushLock was drilled in the bicipital groove just superior to the subscapularis tendon insertion.  The biceps tendon was then cut and the biceps anchor complex was debrided down to a stable base on the superior labrum with an oscillating shaver.  The FiberTape was loaded onto the PushLock anchor and impacted into place into the previously drilled hole in the bicipital groove.  This appropriately secured the biceps into the bicipital groove and took it off of tension.  Next, the arthroscope was then introduced into the subacromial space. A direct lateral portal was created with an 11-blade after spinal needle localization. An extensive subacromial bursectomy and debridement was performed using a combination of the shaver and Arthrocare wand.  The bursal side of the rotator cuff was noted to be intact.  Fluid was evacuated from the shoulder, and the portals were closed with 3-0 Nylon. Xeroform was applied to the portals. A sterile dressing was applied, followed by a Polar Care sleeve and a SlingShot shoulder immobilizer/sling. The patient was awakened from anesthesia without difficulty and was transferred to the PACU in stable condition.     COMPLICATIONS: none  DISPOSITION: plan for discharge home after recovery in PACU   POSTOPERATIVE PLAN: Remain in sling (except hygiene and elbow/wrist/hand RoM exercises as instructed by PT) x 1-2 weeks and NWB for this time. PT to begin 3-4 days after surgery.  Use shoulder arthroscopy/debridement rehab protocol.

## 2021-09-10 NOTE — Transfer of Care (Signed)
Immediate Anesthesia Transfer of Care Note  Patient: Destiny Bryant  Procedure(s) Performed: Right shoulder arthroscopy with Glenohumeral debridement and biceps tenodesis (Right: Shoulder)  Patient Location: PACU  Anesthesia Type: General LMA  Level of Consciousness: awake, alert  and patient cooperative  Airway and Oxygen Therapy: Patient Spontanous Breathing and Patient connected to supplemental oxygen  Post-op Assessment: Post-op Vital signs reviewed, Patient's Cardiovascular Status Stable, Respiratory Function Stable, Patent Airway and No signs of Nausea or vomiting  Post-op Vital Signs: Reviewed and stable  Complications: No notable events documented.

## 2021-09-10 NOTE — Anesthesia Postprocedure Evaluation (Signed)
Anesthesia Post Note  Patient: Destiny Bryant  Procedure(s) Performed: Right shoulder arthroscopy with Glenohumeral debridement and biceps tenodesis (Right: Shoulder)     Patient location during evaluation: PACU Anesthesia Type: General Level of consciousness: awake and alert and oriented Pain management: satisfactory to patient Vital Signs Assessment: post-procedure vital signs reviewed and stable Respiratory status: spontaneous breathing, nonlabored ventilation and respiratory function stable Cardiovascular status: blood pressure returned to baseline and stable Postop Assessment: Adequate PO intake and No signs of nausea or vomiting Anesthetic complications: no   No notable events documented.  Cherly Beach

## 2021-09-10 NOTE — H&P (Signed)
Paper H&P to be scanned into permanent record. H&P reviewed. No significant changes noted.  

## 2021-09-10 NOTE — Anesthesia Procedure Notes (Signed)
Procedure Name: LMA Insertion Date/Time: 09/10/2021 12:49 PM Performed by: Jimmy Picket, CRNA Pre-anesthesia Checklist: Patient identified, Emergency Drugs available, Suction available, Timeout performed and Patient being monitored Patient Re-evaluated:Patient Re-evaluated prior to induction Oxygen Delivery Method: Circle system utilized Preoxygenation: Pre-oxygenation with 100% oxygen Induction Type: IV induction LMA: LMA inserted LMA Size: 4.0 Number of attempts: 1 Placement Confirmation: positive ETCO2 and breath sounds checked- equal and bilateral Tube secured with: Tape

## 2021-09-10 NOTE — Anesthesia Preprocedure Evaluation (Signed)
Anesthesia Evaluation  Patient identified by MRN, date of birth, ID band Patient awake    Reviewed: Allergy & Precautions, H&P , NPO status , Patient's Chart, lab work & pertinent test results  Airway Mallampati: II  TM Distance: >3 FB Neck ROM: full    Dental no notable dental hx.    Pulmonary sleep apnea and Continuous Positive Airway Pressure Ventilation , Current SmokerPatient did not abstain from smoking.,    Pulmonary exam normal breath sounds clear to auscultation       Cardiovascular hypertension, Normal cardiovascular exam Rhythm:regular Rate:Normal     Neuro/Psych    GI/Hepatic   Endo/Other  Morbid obesity  Renal/GU      Musculoskeletal   Abdominal   Peds  Hematology   Anesthesia Other Findings   Reproductive/Obstetrics                             Anesthesia Physical Anesthesia Plan  ASA: 3  Anesthesia Plan: General LMA   Post-op Pain Management:  Regional for Post-op pain   Induction:   PONV Risk Score and Plan: 2 and Treatment may vary due to age or medical condition, Ondansetron, Dexamethasone and Scopolamine patch - Pre-op  Airway Management Planned:   Additional Equipment:   Intra-op Plan:   Post-operative Plan:   Informed Consent: I have reviewed the patients History and Physical, chart, labs and discussed the procedure including the risks, benefits and alternatives for the proposed anesthesia with the patient or authorized representative who has indicated his/her understanding and acceptance.     Dental Advisory Given  Plan Discussed with: CRNA  Anesthesia Plan Comments:         Anesthesia Quick Evaluation

## 2021-09-13 ENCOUNTER — Encounter: Payer: Self-pay | Admitting: Orthopedic Surgery

## 2021-10-26 ENCOUNTER — Other Ambulatory Visit: Payer: Self-pay

## 2021-10-26 ENCOUNTER — Ambulatory Visit
Admission: RE | Admit: 2021-10-26 | Discharge: 2021-10-26 | Disposition: A | Discharge status: Home / Self Care | Payer: Medicaid Other | Source: Ambulatory Visit | Attending: Internal Medicine | Admitting: Internal Medicine

## 2021-10-26 VITALS — BP 125/99 | HR 92 | Temp 98.0°F | Resp 20 | Ht 61.0 in | Wt 225.0 lb

## 2021-10-26 DIAGNOSIS — J42 Unspecified chronic bronchitis: Secondary | ICD-10-CM | POA: Diagnosis not present

## 2021-10-26 DIAGNOSIS — J209 Acute bronchitis, unspecified: Secondary | ICD-10-CM | POA: Diagnosis not present

## 2021-10-26 MED ORDER — BENZONATATE 200 MG PO CAPS: 200.0000 mg | ORAL_CAPSULE | Freq: Three times a day (TID) | ORAL | 0 refills | Status: DC | PRN

## 2021-10-26 MED ORDER — PREDNISONE 50 MG PO TABS: 50.0000 mg | ORAL_TABLET | Freq: Every day | ORAL | 0 refills | Status: DC

## 2021-10-26 MED ORDER — ALBUTEROL SULFATE HFA 108 (90 BASE) MCG/ACT IN AERS: 1.0000 | INHALATION_SPRAY | RESPIRATORY_TRACT | 0 refills | Status: AC | PRN

## 2021-10-26 NOTE — ED Provider Notes (Signed)
[facility]    CSN: [id number] Arrival date & time: 10/26/21  0803      History   Chief Complaint Chief Complaint  Patient presents with   Cough    HPI Destiny Bryant is a 35 y.o. female.  She presents today with about a 10-day history of productive cough, now having increasing difficulty with central chest congestion, head congestion, and fatigue.  Chest is sore with coughing.  No fever, not much sore throat.  No nausea/vomiting/diarrhea.  She does smoke.  HPI  Past Medical History:  Diagnosis Date   Hypertension    Sleep apnea     There are no problems to display for this patient.   Past Surgical History:  Procedure Laterality Date   ROTATOR CUFF REPAIR     SHOULDER ARTHROSCOPY WITH BICEPS TENDON REPAIR Right 09/10/2021   Procedure: Right shoulder arthroscopy with Glenohumeral debridement and biceps tenodesis;  Surgeon: Signa Kell, MD;  Location: [facility];  Service: Orthopedics;  Laterality: Right;     Home Medications    Prior to Admission medications   Medication Sig Start Date End Date Taking? Authorizing Provider  albuterol (VENTOLIN HFA) 108 (90 Base) MCG/ACT inhaler Inhale 1-2 puffs into the lungs every 4 (four) hours as needed for wheezing or shortness of breath. 10/26/21  Yes Isa Rankin, MD  benzonatate (TESSALON) 200 MG capsule Take 1 capsule (200 mg total) by mouth 3 (three) times daily as needed for cough. 10/26/21  Yes Isa Rankin, MD  hydrochlorothiazide (HYDRODIURIL) 25 MG tablet Take 25 mg by mouth daily. 07/14/20  Yes [provider]  mirtazapine (REMERON) 15 MG tablet Take 15 mg by mouth at bedtime.   Yes [provider]  predniSONE (DELTASONE) 50 MG tablet Take 1 tablet (50 mg total) by mouth daily. 10/26/21  Yes Isa Rankin, MD  oxyCODONE (ROXICODONE) 5 MG immediate release tablet Take 1-2 tablets (5-10 mg total) by mouth every 4 (four) hours as needed (pain). 09/10/21 09/10/22   Signa Kell, MD    Family History Family History  Problem Relation Age of Onset   Hypertension Mother    Hypertension Father     Social History Social History   Tobacco Use   Smoking status: Some Days   Smokeless tobacco: Never   Tobacco comments:    1 cig every 2-3 days  Vaping Use   Vaping Use: Never used  Substance Use Topics   Alcohol use: Yes    Comment: social   Drug use: Never     Allergies   Patient has no known allergies.   Review of Systems Review of Systems see HPI   Physical Exam Triage Vital Signs ED Triage Vitals  Enc Vitals Group     BP 10/26/21 0815 (!) 125/99     Pulse Rate 10/26/21 0815 92     Resp 10/26/21 0815 20     Temp 10/26/21 0815 98 F (36.7 C)     Temp Source 10/26/21 0815 Oral     SpO2 10/26/21 0815 98 %     Weight 10/26/21 0813 225 lb (102.1 kg)     Height 10/26/21 0813 5\' 1"  (1.549 m)     Pain Score 10/26/21 0813 7     Pain Loc --    Updated Vital Signs BP (!) 125/99 (BP Location: Left Arm) Comment: Pt has not taking her medication  Pulse 92   Temp 98 F (36.7 C) (Oral)   Resp 20  Ht 5\' 1"  (1.549 m)   Wt 102.1 kg   LMP 10/12/2021   SpO2 98%   BMI 42.51 kg/m     Physical Exam Constitutional:      General: She is not in acute distress.    Appearance: She is ill-appearing. She is not toxic-appearing.     Comments: Nicely groomed Voice sounds quite congested  HENT:     Head: Atraumatic.     Comments: Bilateral TMs slightly dull, left is pink flushed Moderately severe nasal congestion Posterior pharynx is poorly visualized Cardiovascular:     Rate and Rhythm: Normal rate and regular rhythm.  Pulmonary:     Effort: Pulmonary effort is normal. No respiratory distress.     Breath sounds: No rhonchi.     Comments: Coarse breath sounds throughout with occasional expiratory soft wheeze Abdominal:     Comments: Not distended  Musculoskeletal:        General: No swelling.     Cervical back: Neck supple.  Skin:     General: Skin is warm and dry.     Comments: Not cyanotic  Neurological:     Mental Status: She is alert.     Comments: Walked into the urgent care independently, climbed on/off exam table without assistance Face is symmetric, speech is clear, coherent, logical    UC Treatments / Results  Labs (all labs ordered are listed, but only abnormal results are displayed) Labs Reviewed - No data to display Labs not done/indicated at urgent care today  EKG N/A  Radiology No results found. Imaging not done/indicated at urgent care today  Procedures Procedures (including critical care time) N/A  Medications Ordered in UC Medications - No data to display No meds given at urgent care today  Final Clinical Impressions(s) / UC Diagnoses   Final diagnoses:  Acute exacerbation of chronic bronchitis (HCC)     Discharge Instructions      Symptoms today seem most consistent with bronchitis.  Prescriptions for prednisone (for cough/wheezing), benzonatate (for cough), and for albuterol inhaler (for cough/wheezing) were sent to the pharmacy.  Push fluids and rest.  Take tylenol or advil otc as needed for fever, discomfort.  Eat fruits and vegetables to help your immune system do its best work.  Anticipate gradual improvement over the next several days.  Recheck for new fever >100.5, increasing phlegm production/nasal discharge, or if not starting to improve in a few days.       ED Prescriptions     Medication Sig Dispense Auth. Provider   predniSONE (DELTASONE) 50 MG tablet Take 1 tablet (50 mg total) by mouth daily. 3 tablet 10/14/2021, MD   albuterol (VENTOLIN HFA) 108 (90 Base) MCG/ACT inhaler Inhale 1-2 puffs into the lungs every 4 (four) hours as needed for wheezing or shortness of breath. 1 each Isa Rankin, MD   benzonatate (TESSALON) 200 MG capsule Take 1 capsule (200 mg total) by mouth 3 (three) times daily as needed for cough. 30 capsule Isa Rankin, MD       PDMP not reviewed this encounter.   Isa Rankin, MD 10/27/21 [phone]

## 2021-10-26 NOTE — ED Triage Notes (Signed)
Pt here with C/O cough, for 1.5week. Denies fever. Ribs are sore, body is fatigue.

## 2021-10-26 NOTE — Discharge Instructions (Signed)
Symptoms today seem most consistent with bronchitis.  Prescriptions for prednisone (for cough/wheezing), benzonatate (for cough), and for albuterol inhaler (for cough/wheezing) were sent to the pharmacy.  Push fluids and rest.  Take tylenol or advil otc as needed for fever, discomfort.  Eat fruits and vegetables to help your immune system do its best work.  Anticipate gradual improvement over the next several days.  Recheck for new fever >100.5, increasing phlegm production/nasal discharge, or if not starting to improve in a few days.

## 2021-12-12 ENCOUNTER — Emergency Department: Admit: 2021-12-12 | Payer: Medicaid Other

## 2022-02-23 ENCOUNTER — Ambulatory Visit
Admission: EM | Admit: 2022-02-23 | Discharge: 2022-02-23 | Disposition: A | Discharge status: Home / Self Care | Payer: Medicaid Other

## 2022-02-23 DIAGNOSIS — S39012A Strain of muscle, fascia and tendon of lower back, initial encounter: Secondary | ICD-10-CM | POA: Diagnosis not present

## 2022-02-23 MED ORDER — METHOCARBAMOL 500 MG PO TABS: 500.0000 mg | ORAL_TABLET | Freq: Three times a day (TID) | ORAL | 0 refills | Status: DC | PRN

## 2022-02-23 MED ORDER — DEXAMETHASONE SODIUM PHOSPHATE 10 MG/ML IJ SOLN
10.0000 mg | Freq: Once | INTRAMUSCULAR | Status: AC
  Administered 2022-02-23: 10 mg via INTRAMUSCULAR

## 2022-02-23 MED ORDER — KETOROLAC TROMETHAMINE 60 MG/2ML IM SOLN
60.0000 mg | Freq: Once | INTRAMUSCULAR | Status: AC
Start: 2022-02-23 — End: 2022-02-23
  Administered 2022-02-23: 60 mg via INTRAMUSCULAR

## 2022-02-23 MED ORDER — NAPROXEN 500 MG PO TABS: 500.0000 mg | ORAL_TABLET | Freq: Two times a day (BID) | ORAL | 0 refills | Status: DC
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
## 2022-05-22 ENCOUNTER — Encounter: Payer: Self-pay | Admitting: Emergency Medicine

## 2022-05-22 ENCOUNTER — Ambulatory Visit
Admission: EM | Admit: 2022-05-22 | Discharge: 2022-05-22 | Disposition: A | Discharge status: Home / Self Care | Payer: Medicaid Other | Attending: Emergency Medicine | Admitting: Emergency Medicine

## 2022-05-22 DIAGNOSIS — J4520 Mild intermittent asthma, uncomplicated: Secondary | ICD-10-CM

## 2022-05-22 MED ORDER — CETIRIZINE HCL 10 MG PO TABS: 10.0000 mg | ORAL_TABLET | Freq: Every day | ORAL | 1 refills | Status: AC

## 2022-05-22 MED ORDER — PREDNISONE 50 MG PO TABS: 50.0000 mg | ORAL_TABLET | Freq: Every day | ORAL | 0 refills | Status: DC

## 2022-05-22 MED ORDER — MONTELUKAST SODIUM 10 MG PO TABS: 10.0000 mg | ORAL_TABLET | Freq: Every day | ORAL | 1 refills | Status: AC

## 2022-05-22 NOTE — ED Triage Notes (Addendum)
Patient c/o cough and chest congestion for the past 4 days.  Patient has history of asthma.  Patient last used her albuterol inhaler yesterday.  Patient also reports an insect bite to her left upper arm yesterday.  Patient reports redness, swelling and tenderness at the site.

## 2022-05-22 NOTE — ED Provider Notes (Signed)
[facility] [place] [facility] [place]   Name: Destiny Bryant DOB: 16-Sep-1986 MRN: [id number] CSN: [id number] PCP: Emogene Morgan, MD  Arrival date and time:  05/22/22 1604  Chief Complaint:  Cough and Insect Bite   NOTE: Prior to seeing the patient today, I have reviewed the triage nursing documentation and vital signs. Clinical staff has updated patient's PMH/PSHx, current medication list, and drug allergies/intolerances to ensure comprehensive history available to assist in medical decision making.   History:   HPI: Destiny Bryant is a 36 y.o. female who presents today with complaints of shortness of breath and wheezing.  Patient has a known history of asthma.  She has noticed increased wheezing and shortness of breath over the past 48-72 hours.  She just recently got her albuterol refilled, but she has yet to use it.  She denies any fevers, chills or body aches.  Patient also has a known history of hypertension.  She has not taken her prescribed antihypertensive medication for the past 2 days.   Past Medical History:  Diagnosis Date   Hypertension    Sleep apnea     Past Surgical History:  Procedure Laterality Date   ROTATOR CUFF REPAIR     SHOULDER ARTHROSCOPY WITH BICEPS TENDON REPAIR Right 09/10/2021   Procedure: Right shoulder arthroscopy with Glenohumeral debridement and biceps tenodesis;  Surgeon: Signa Kell, MD;  Location: [facility];  Service: Orthopedics;  Laterality: Right;    Family History  Problem Relation Age of Onset   Hypertension Mother    Hypertension Father     Social History   Tobacco Use   Smoking status: Some Days    Types: Cigarettes   Smokeless tobacco: Never   Tobacco comments:    1 cig every 2-3 days  Vaping Use   Vaping Use: Never used  Substance Use Topics   Alcohol use: Yes    Comment: social   Drug use: Never    There are no problems to display for this patient.   Home Medications:    Current Meds  Medication  Sig   cetirizine (ZYRTEC) 10 MG tablet Take 1 tablet (10 mg total) by mouth daily.   montelukast (SINGULAIR) 10 MG tablet Take 1 tablet (10 mg total) by mouth at bedtime.   predniSONE (DELTASONE) 50 MG tablet Take 1 tablet (50 mg total) by mouth daily with breakfast.    Allergies:   Patient has no known allergies.  Review of Systems (ROS): Review of Systems  Constitutional:  Negative for appetite change, chills, fatigue and fever.  Respiratory:  Positive for cough, chest tightness, shortness of breath and wheezing. Negative for apnea.   Neurological:  Negative for dizziness and headaches.     Vital Signs: Today's Vitals   05/22/22 1613 05/22/22 1615 05/22/22 1638  BP:  (!) 152/125   Pulse:  90   Resp:  14   Temp:  98.7 F (37.1 C)   TempSrc:  Oral   SpO2:  100%   Weight: 216 lb 14.9 oz (98.4 kg)    Height: 5\' 1"  (1.549 m)    PainSc: 0-No pain  0-No pain    Physical Exam: Physical Exam Vitals and nursing note reviewed.  Constitutional:      General: She is not in acute distress.    Appearance: Normal appearance. She is not toxic-appearing.  Cardiovascular:     Rate and Rhythm: Normal rate and regular rhythm.     Pulses: Normal pulses.  Heart sounds: Normal heart sounds.  Pulmonary:     Effort: Pulmonary effort is normal. No tachypnea, accessory muscle usage or respiratory distress.     Breath sounds: Wheezing present. No rhonchi or rales.  Skin:    General: Skin is warm and dry.  Neurological:     General: No focal deficit present.     Mental Status: She is alert and oriented to person, place, and time.  Psychiatric:        Mood and Affect: Mood normal.        Behavior: Behavior normal.      Urgent Care Treatments / Results:   LABS: PLEASE NOTE: all labs that were ordered this encounter are listed, however only abnormal results are displayed. Labs Reviewed - No data to display  EKG: -None  RADIOLOGY: No results  found.  PROCEDURES: Procedures  MEDICATIONS RECEIVED THIS VISIT: Medications - No data to display  PERTINENT CLINICAL COURSE NOTES/UPDATES:   Initial Impression / Assessment and Plan / Urgent Care Course:  Pertinent labs & imaging results that were available during my care of the patient were personally reviewed by me and considered in my medical decision making (see lab/imaging section of note for values and interpretations).  Destiny Bryant is a 36 y.o. female who presents to [facility] today with complaints of cough and shortness of breath, diagnosed with mild asthma, and treated as such with the medications below. NP and patient reviewed discharge instructions below during visit.   Patient is well appearing overall in clinic today. She does not appear to be in any acute distress. Presenting symptoms (see HPI) and exam as documented above.   I have reviewed the follow up and strict return precautions for any new or worsening symptoms. Patient is aware of symptoms that would be deemed urgent/emergent, and would thus require further evaluation either here or in the emergency department. At the time of discharge, she verbalized understanding and consent with the discharge plan as it was reviewed with her. All questions were fielded by provider and/or clinic staff prior to patient discharge.    Final Clinical Impressions / Urgent Care Diagnoses:   Final diagnoses:  Mild intermittent asthma, unspecified whether complicated    New Prescriptions:  [place] Controlled Substance Registry consulted? Not Applicable  Meds ordered this encounter  Medications   predniSONE (DELTASONE) 50 MG tablet    Sig: Take 1 tablet (50 mg total) by mouth daily with breakfast.    Dispense:  5 tablet    Refill:  0   montelukast (SINGULAIR) 10 MG tablet    Sig: Take 1 tablet (10 mg total) by mouth at bedtime.    Dispense:  14 tablet    Refill:  1   cetirizine (ZYRTEC) 10 MG tablet    Sig: Take 1 tablet  (10 mg total) by mouth daily.    Dispense:  30 tablet    Refill:  1      Discharge Instructions      You were seen for cough and shortness of breath and are being treated for asthma attack.   - Use your albuterol inhaler every 4 hours for the next two days. Then use as needed. - Start taking your blood pressure medication everyday.  -Take the steroids and allergy medications as directed.  - Warm compresses to bug bite.   Take care, Dr. Sharlet Salina, NP-c     Recommended Follow up Care:  Patient encouraged to follow up with the following provider within the  specified time frame, or sooner as dictated by the severity of her symptoms. As always, she was instructed that for any urgent/emergent care needs, she should seek care either here or in the emergency department for more immediate evaluation.   Bailey Mech, DNP, NP-c   Bailey Mech, NP 05/22/22 [phone]

## 2022-05-22 NOTE — Discharge Instructions (Signed)
You were seen for cough and shortness of breath and are being treated for asthma attack.   - Use your albuterol inhaler every 4 hours for the next two days. Then use as needed. - Start taking your blood pressure medication everyday.  -Take the steroids and allergy medications as directed.  - Warm compresses to bug bite.   Take care, Dr. Sharlet Salina, NP-c

## 2022-07-14 ENCOUNTER — Ambulatory Visit (INDEPENDENT_AMBULATORY_CARE_PROVIDER_SITE_OTHER): Payer: Medicaid Other

## 2022-07-14 ENCOUNTER — Ambulatory Visit
Admission: EM | Admit: 2022-07-14 | Discharge: 2022-07-14 | Disposition: A | Discharge status: Home / Self Care | Payer: Medicaid Other | Attending: Family Medicine | Admitting: Family Medicine

## 2022-07-14 ENCOUNTER — Other Ambulatory Visit: Payer: Self-pay

## 2022-07-14 DIAGNOSIS — M79642 Pain in left hand: Secondary | ICD-10-CM

## 2022-07-14 DIAGNOSIS — M7989 Other specified soft tissue disorders: Secondary | ICD-10-CM

## 2022-07-14 MED ORDER — NAPROXEN 500 MG PO TABS: 500.0000 mg | ORAL_TABLET | Freq: Two times a day (BID) | ORAL | 0 refills | Status: DC

## 2022-07-14 MED ORDER — METHOCARBAMOL 500 MG PO TABS: 500.0000 mg | ORAL_TABLET | Freq: Three times a day (TID) | ORAL | 0 refills | Status: DC | PRN
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
## 2022-07-14 NOTE — Discharge Instructions (Signed)
Your x-ray did not show any fractures or dislocated bones in your hand.  You can follow-up with [facility] in [place] or the [facility] here in [place], if your pain does not improve.  I sent some medication for pain and inflammation to your pharmacy.  Can also take Tylenol over-the-counter.  Please avoid all other medications if you are going to take the prescribed medicine.

## 2022-08-25 DIAGNOSIS — G4733 Obstructive sleep apnea (adult) (pediatric): Secondary | ICD-10-CM | POA: Diagnosis not present

## 2022-08-28 ENCOUNTER — Encounter: Payer: Self-pay | Admitting: Emergency Medicine

## 2022-08-28 ENCOUNTER — Ambulatory Visit
Admission: EM | Admit: 2022-08-28 | Discharge: 2022-08-28 | Disposition: A | Discharge status: Home / Self Care | Payer: Medicaid Other

## 2022-08-28 DIAGNOSIS — J020 Streptococcal pharyngitis: Secondary | ICD-10-CM | POA: Diagnosis not present

## 2022-08-28 MED ORDER — IBUPROFEN 800 MG PO TABS: 800.0000 mg | ORAL_TABLET | Freq: Three times a day (TID) | ORAL | 0 refills | Status: DC

## 2022-08-28 MED ORDER — AMOXICILLIN-POT CLAVULANATE 875-125 MG PO TABS: 1.0000 | ORAL_TABLET | Freq: Two times a day (BID) | ORAL | 0 refills | Status: AC

## 2022-08-28 NOTE — Discharge Instructions (Signed)
Today you are being treated for strep pharyngitis  I have changed her antibiotic to Augmentin which provides additional bacterial coverage, take every morning and every evening for the next 10 days, you have to be patient with your body and give the medicine time to take effect, it can take up to 48 hours before you start to see any improvement  May use ibuprofen 800 mg every 8 hours and Tylenol 1000 mg every 6 hours for management of discomfort  May attempt soft foods, warm to cold liquids to preference, continue use of over-the-counter Clorox septic spray, salt water gargles and Listerine gargles as needed for additional comfort  Continue to increase your fluid intake into your able to tolerate food is normal  If you continue to see no improvement in your symptoms, please follow-up as needed

## 2022-08-28 NOTE — ED Provider Notes (Signed)
[facility]    CSN: [id number] Arrival date & time: 08/28/22  1412      History   Chief Complaint Chief Complaint  Patient presents with   Sore Throat    HPI Destiny Bryant is a 36 y.o. female.   Patient presents with sore throat, left-sided neck pain, left-sided ear pain , hoarseness, body aches and malaise for 4 days.  Painful to swallow.  Was evaluated 3 days ago, diagnosed with strep and started penicillin.  Endorses no improvement in symptoms but denies worsening.  Additionally has attempted Tylenol, ibuprofen, Chloraseptic spray, warm to cold fluids for additional comfort.  Denies fevers.  Past Medical History:  Diagnosis Date   Hypertension    Sleep apnea     There are no problems to display for this patient.   Past Surgical History:  Procedure Laterality Date   ROTATOR CUFF REPAIR     SHOULDER ARTHROSCOPY WITH BICEPS TENDON REPAIR Right 09/10/2021   Procedure: Right shoulder arthroscopy with Glenohumeral debridement and biceps tenodesis;  Surgeon: Signa Kell, MD;  Location: [facility];  Service: Orthopedics;  Laterality: Right;    OB History   No obstetric history on file.      Home Medications    Prior to Admission medications   Medication Sig Start Date End Date Taking? Authorizing Provider  cetirizine (ZYRTEC) 10 MG tablet Take 1 tablet (10 mg total) by mouth daily. 05/22/22  Yes Bailey Mech, NP  hydrochlorothiazide (HYDRODIURIL) 25 MG tablet Take 25 mg by mouth daily. 07/14/20  Yes [provider]  methocarbamol (ROBAXIN) 500 MG tablet Take 1 tablet (500 mg total) by mouth every 8 (eight) hours as needed for muscle spasms. 07/14/22  Yes Brimage, Vondra, DO  montelukast (SINGULAIR) 10 MG tablet Take 1 tablet (10 mg total) by mouth at bedtime. 05/22/22  Yes Bailey Mech, NP  albuterol (VENTOLIN HFA) 108 (90 Base) MCG/ACT inhaler Inhale 1-2 puffs into the lungs every 4 (four) hours as needed for wheezing or shortness of  breath. Patient taking differently: Inhale 1-2 puffs into the lungs every 4 (four) hours as needed for wheezing or shortness of breath. Needs refill. 10/26/21   Isa Rankin, MD  naproxen (NAPROSYN) 500 MG tablet Take 1 tablet (500 mg total) by mouth 2 (two) times daily with a meal. 07/14/22   Brimage, Vondra, DO  penicillin v potassium (VEETID) 500 MG tablet Take 500 mg by mouth 2 (two) times daily. 08/26/22   [provider]    Family History Family History  Problem Relation Age of Onset   Hypertension Mother    Hypertension Father     Social History Social History   Tobacco Use   Smoking status: Some Days    Packs/day: 0.25    Types: Cigarettes   Smokeless tobacco: Never   Tobacco comments:    1 cig every 2-3 days  Vaping Use   Vaping Use: Never used  Substance Use Topics   Alcohol use: Yes    Comment: social   Drug use: Never     Allergies   Patient has no known allergies.   Review of Systems Review of Systems  Constitutional: Negative.   HENT:  Positive for ear pain and sore throat. Negative for congestion, dental problem, drooling, ear discharge, facial swelling, hearing loss, mouth sores, nosebleeds, postnasal drip, rhinorrhea, sinus pressure, sinus pain, sneezing, tinnitus, trouble swallowing and voice change.   Respiratory: Negative.    Cardiovascular: Negative.   Skin: Negative.  Physical Exam Triage Vital Signs ED Triage Vitals  Enc Vitals Group     BP 08/28/22 1434 (!) 147/112     Pulse Rate 08/28/22 1434 93     Resp 08/28/22 1434 16     Temp 08/28/22 1434 98.4 F (36.9 C)     Temp Source 08/28/22 1434 Oral     SpO2 08/28/22 1434 100 %     Weight 08/28/22 1433 210 lb 15.7 oz (95.7 kg)     Height 08/28/22 1433 5\' 1"  (1.549 m)     Head Circumference --      Peak Flow --      Pain Score 08/28/22 1432 9     Pain Loc --      Pain Edu? --      Excl. in [place]? --    No data found.  Updated Vital Signs BP (!) 147/112 (BP  Location: Right Arm) Comment: pt states she has not taken her BP medication today.  Pulse 93   Temp 98.4 F (36.9 C) (Oral)   Resp 16   Ht 5\' 1"  (1.549 m)   Wt 210 lb 15.7 oz (95.7 kg)   LMP 08/24/2022 (Approximate)   SpO2 100%   BMI 39.86 kg/m   Visual Acuity Right Eye Distance:   Left Eye Distance:   Bilateral Distance:    Right Eye Near:   Left Eye Near:    Bilateral Near:     Physical Exam Constitutional:      Appearance: She is well-developed.  HENT:     Head: Normocephalic.     Right Ear: Tympanic membrane and ear canal normal.     Left Ear: Tympanic membrane and ear canal normal.     Nose: No congestion or rhinorrhea.     Mouth/Throat:     Mouth: Mucous membranes are moist.     Pharynx: Posterior oropharyngeal erythema present.     Tonsils: Tonsillar exudate present. 2+ on the right. 2+ on the left.  Cardiovascular:     Rate and Rhythm: Normal rate and regular rhythm.     Heart sounds: Normal heart sounds.  Pulmonary:     Effort: Pulmonary effort is normal.     Breath sounds: Normal breath sounds.  Abdominal:     General: Bowel sounds are normal.     Palpations: Abdomen is soft.  Musculoskeletal:     Cervical back: Normal range of motion and neck supple.  Neurological:     General: No focal deficit present.     Mental Status: She is alert and oriented to person, place, and time.      UC Treatments / Results  Labs (all labs ordered are listed, but only abnormal results are displayed) Labs Reviewed - No data to display  EKG   Radiology No results found.  Procedures Procedures (including critical care time)  Medications Ordered in UC Medications - No data to display  Initial Impression / Assessment and Plan / UC Course  I have reviewed the triage vital signs and the nursing notes.  Pertinent labs & imaging results that were available during my care of the patient were reviewed by me and considered in my medical decision making (see chart for  details).  Strep pharyngitis  Known diagnoses, erythema, tonsillar adenopathy with exudate are noted on exam and patient has completed 2 full days of medication, discussed that it typically takes about 48 to 72 hours before you begin to see improvement and that she must be patient  with her body and with the medication, requesting change, medication changed to Augmentin to provide additional bacterial coverage as well as prescribed ibuprofen 800 mg for management of pain, may continue additional supportive measures with follow-up as needed if symptoms worsen Final Clinical Impressions(s) / UC Diagnoses   Final diagnoses:  Strep pharyngitis   Discharge Instructions   None    ED Prescriptions   None    PDMP not reviewed this encounter.   Hans Eden, NP 08/28/22 1449

## 2022-08-28 NOTE — ED Triage Notes (Signed)
Pt states she was diagnosed with strep 2 days ago. She states she is not worse but she is not getting better as fast as she thinks she should. Denies fever. She states her throat is still swollen and hard to swallow.

## 2022-10-05 ENCOUNTER — Ambulatory Visit: Payer: Medicaid Other

## 2022-10-05 ENCOUNTER — Ambulatory Visit
Admission: EM | Admit: 2022-10-05 | Discharge: 2022-10-05 | Disposition: A | Discharge status: Home / Self Care | Payer: Medicaid Other | Attending: Emergency Medicine | Admitting: Emergency Medicine

## 2022-10-05 ENCOUNTER — Ambulatory Visit (INDEPENDENT_AMBULATORY_CARE_PROVIDER_SITE_OTHER): Payer: Medicaid Other

## 2022-10-05 DIAGNOSIS — M79672 Pain in left foot: Secondary | ICD-10-CM

## 2022-10-05 DIAGNOSIS — M7989 Other specified soft tissue disorders: Secondary | ICD-10-CM | POA: Diagnosis not present

## 2022-10-05 MED ORDER — CYCLOBENZAPRINE HCL 10 MG PO TABS: 10.0000 mg | ORAL_TABLET | Freq: Every day | ORAL | 0 refills | Status: DC

## 2022-10-05 MED ORDER — KETOROLAC TROMETHAMINE 30 MG/ML IJ SOLN
30.0000 mg | Freq: Once | INTRAMUSCULAR | Status: AC
  Administered 2022-10-05: 30 mg via INTRAMUSCULAR

## 2022-10-05 MED ORDER — PREDNISONE 20 MG PO TABS: 40.0000 mg | ORAL_TABLET | Freq: Every day | ORAL | 0 refills | Status: DC

## 2022-10-05 NOTE — ED Triage Notes (Signed)
Patient reports that Monday she opened her freezer and some frozen meat fell on her left foot.

## 2022-10-05 NOTE — Discharge Instructions (Addendum)
X-ray is negative for injury to the bone, is able to visualize significant tissue swelling which is something that will improve with time  Starting tomorrow begin use of prednisone every morning with food for 5 days to reduce inflammation to your injury which in turn will help with your pain  May use muscle relaxer beginning today at bedtime as needed, be mindful this medication will make you drowsy  You may use ice over the affected area 10 to 15-minute intervals to help to reduce swelling, you may also use heat if you find it comforting  You may continue to complete activity as tolerated  If your symptoms persist past 2 weeks please follow-up with orthopedics for reevaluation of your foot information is listed on front page

## 2022-10-05 NOTE — ED Provider Notes (Signed)
[facility]    CSN: [id number] Arrival date & time: 10/05/22  1435      History   Chief Complaint Chief Complaint  Patient presents with   Foot Injury    LEFT     HPI Destiny Bryant is a 36 y.o. female.     Presents with left foot pain beginning 3 days ago after a frozen piece of meat fell from the freezer onto the foot, landing directly onto the midfoot, fourth and fifth toe.  Endorses swelling to the area with significant pain worsened with palpation.  Has been painful to bear weight and painful to complete range of motion.  Has attempted use of over-the-counter analgesics which has provided no comfort.  Denies prior injury or trauma, numbness or tingling.  Past Medical History:  Diagnosis Date   Hypertension    Sleep apnea     There are no problems to display for this patient.   Past Surgical History:  Procedure Laterality Date   ROTATOR CUFF REPAIR     SHOULDER ARTHROSCOPY WITH BICEPS TENDON REPAIR Right 09/10/2021   Procedure: Right shoulder arthroscopy with Glenohumeral debridement and biceps tenodesis;  Surgeon: Signa Kell, MD;  Location: [facility];  Service: Orthopedics;  Laterality: Right;    OB History   No obstetric history on file.      Home Medications    Prior to Admission medications   Medication Sig Start Date End Date Taking? Authorizing Provider  cyclobenzaprine (FLEXERIL) 10 MG tablet Take 1 tablet (10 mg total) by mouth at bedtime. 10/05/22  Yes Baruch Lewers R, NP  predniSONE (DELTASONE) 20 MG tablet Take 2 tablets (40 mg total) by mouth daily. 10/05/22  Yes Yona Stansbury R, NP  albuterol (VENTOLIN HFA) 108 (90 Base) MCG/ACT inhaler Inhale 1-2 puffs into the lungs every 4 (four) hours as needed for wheezing or shortness of breath. Patient taking differently: Inhale 1-2 puffs into the lungs every 4 (four) hours as needed for wheezing or shortness of breath. Needs refill. 10/26/21   Isa Rankin, MD   cetirizine (ZYRTEC) 10 MG tablet Take 1 tablet (10 mg total) by mouth daily. 05/22/22   Bailey Mech, NP  hydrochlorothiazide (HYDRODIURIL) 25 MG tablet Take 25 mg by mouth daily. 07/14/20   [provider]  ibuprofen (ADVIL) 800 MG tablet Take 1 tablet (800 mg total) by mouth 3 (three) times daily. 08/28/22   Melanye Hiraldo, Elita Boone, NP  methocarbamol (ROBAXIN) 500 MG tablet Take 1 tablet (500 mg total) by mouth every 8 (eight) hours as needed for muscle spasms. 07/14/22   Brimage, Seward Meth, DO  montelukast (SINGULAIR) 10 MG tablet Take 1 tablet (10 mg total) by mouth at bedtime. 05/22/22   Bailey Mech, NP  naproxen (NAPROSYN) 500 MG tablet Take 1 tablet (500 mg total) by mouth 2 (two) times daily with a meal. 07/14/22   Brimage, Vondra, DO  penicillin v potassium (VEETID) 500 MG tablet Take 500 mg by mouth 2 (two) times daily. 08/26/22   [provider]    Family History Family History  Problem Relation Age of Onset   Hypertension Mother    Hypertension Father     Social History Social History   Tobacco Use   Smoking status: Some Days    Packs/day: 0.25    Types: Cigarettes   Smokeless tobacco: Never   Tobacco comments:    1 cig every 2-3 days  Vaping Use   Vaping Use: Never used  Substance Use Topics  Alcohol use: Yes    Comment: social   Drug use: Never     Allergies   Patient has no known allergies.   Review of Systems Review of Systems  Constitutional: Negative.   Respiratory: Negative.    Cardiovascular: Negative.   Musculoskeletal:  Positive for gait problem and joint swelling. Negative for arthralgias, back pain, myalgias, neck pain and neck stiffness.  Skin: Negative.      Physical Exam Triage Vital Signs ED Triage Vitals  Enc Vitals Group     BP 10/05/22 1458 117/82     Pulse Rate 10/05/22 1458 (!) 101     Resp --      Temp 10/05/22 1458 98.8 F (37.1 C)     Temp Source 10/05/22 1458 Oral     SpO2 10/05/22 1458 95 %     Weight  10/05/22 1457 212 lb (96.2 kg)     Height 10/05/22 1457 5\' 1"  (1.549 m)     Head Circumference --      Peak Flow --      Pain Score 10/05/22 1457 8     Pain Loc --      Pain Edu? --      Excl. in GC? --    No data found.  Updated Vital Signs BP 117/82 (BP Location: Left Arm)   Pulse (!) 101   Temp 98.8 F (37.1 C) (Oral)   Ht 5\' 1"  (1.549 m)   Wt 212 lb (96.2 kg)   LMP 09/21/2022   SpO2 95%   BMI 40.06 kg/m   Visual Acuity Right Eye Distance:   Left Eye Distance:   Bilateral Distance:    Right Eye Near:   Left Eye Near:    Bilateral Near:     Physical Exam Constitutional:      Appearance: Normal appearance.  HENT:     Head: Normocephalic.  Eyes:     Extraocular Movements: Extraocular movements intact.  Pulmonary:     Effort: Pulmonary effort is normal.  Musculoskeletal:     Comments: Moderate to severe swelling and tenderness present to the lateral aspect of the midfoot and dorsum aspect of the midfoot, mild ecchymosis, tenderness generalized to the fifth and fourth toe without point tenderness, able to bear weight but elicits pain, limited range of motion, unable to flex at this time to posterior cells pedis pulse, capillary refills are less than 3  Neurological:     Mental Status: She is alert and oriented to person, place, and time. Mental status is at baseline.  Psychiatric:        Mood and Affect: Mood normal.        Behavior: Behavior normal.      UC Treatments / Results  Labs (all labs ordered are listed, but only abnormal results are displayed) Labs Reviewed - No data to display  EKG   Radiology DG Foot Complete Left  Result Date: 10/05/2022 CLINICAL DATA:  Object fell on foot on Monday. Pain along the top of the foot. EXAM: LEFT FOOT - COMPLETE 3+ VIEW COMPARISON:  None Available. FINDINGS: Mildly flattened longitudinal arch of the foot although nonspecific given the non standing nature of the lateral projection. Dorsal soft tissue swelling  along the foot without underlying fracture identified. IMPRESSION: 1. Dorsal soft tissue swelling along the foot without underlying fracture identified. Electronically Signed   By: 10/07/2022 M.D.   On: 10/05/2022 15:48    Procedures Procedures (including critical care time)  Medications Ordered  in UC Medications  ketorolac (TORADOL) 30 MG/ML injection 30 mg (30 mg Intramuscular Given 10/05/22 1609)    Initial Impression / Assessment and Plan / UC Course  I have reviewed the triage vital signs and the nursing notes.  Pertinent labs & imaging results that were available during my care of the patient were reviewed by me and considered in my medical decision making (see chart for details).  Left foot pain  X-ray negative, discussed findings with patient, Toradol injection in office and prescribed prednisone and Flexeril for outpatient use, RICE, heat, CBD as tolerated, Ace bandage applied in office by nursing staff, established patient with [facility] Ortho, recommended follow-up if symptoms persist past 2 weeks Final Clinical Impressions(s) / UC Diagnoses   Final diagnoses:  Left foot pain     Discharge Instructions      X-ray is negative for injury to the bone, is able to visualize significant tissue swelling which is something that will improve with time  Starting tomorrow begin use of prednisone every morning with food for 5 days to reduce inflammation to your injury which in turn will help with your pain  May use muscle relaxer beginning today at bedtime as needed, be mindful this medication will make you drowsy  You may use ice over the affected area 10 to 15-minute intervals to help to reduce swelling, you may also use heat if you find it comforting  You may continue to complete activity as tolerated  If your symptoms persist past 2 weeks please follow-up with orthopedics for reevaluation of your foot information is listed on front page   ED Prescriptions      Medication Sig Dispense Auth. Provider   predniSONE (DELTASONE) 20 MG tablet Take 2 tablets (40 mg total) by mouth daily. 10 tablet Rozetta Stumpp, Hansel Starling R, NP   cyclobenzaprine (FLEXERIL) 10 MG tablet Take 1 tablet (10 mg total) by mouth at bedtime. 10 tablet Valinda Hoar, NP      PDMP not reviewed this encounter.   Valinda Hoar, NP 10/05/22 1626

## 2022-10-08 ENCOUNTER — Emergency Department
Admission: EM | Admit: 2022-10-08 | Discharge: 2022-10-08 | Disposition: A | Discharge status: Home / Self Care | Payer: Private Health Insurance - Indemnity | Attending: Emergency Medicine | Admitting: Emergency Medicine

## 2022-10-08 ENCOUNTER — Encounter: Payer: Self-pay | Admitting: Emergency Medicine

## 2022-10-08 ENCOUNTER — Emergency Department: Payer: Private Health Insurance - Indemnity

## 2022-10-08 ENCOUNTER — Other Ambulatory Visit: Payer: Self-pay

## 2022-10-08 DIAGNOSIS — K2901 Acute gastritis with bleeding: Secondary | ICD-10-CM | POA: Insufficient documentation

## 2022-10-08 DIAGNOSIS — D72829 Elevated white blood cell count, unspecified: Secondary | ICD-10-CM | POA: Diagnosis not present

## 2022-10-08 DIAGNOSIS — R1013 Epigastric pain: Secondary | ICD-10-CM | POA: Diagnosis present

## 2022-10-08 DIAGNOSIS — R109 Unspecified abdominal pain: Secondary | ICD-10-CM | POA: Diagnosis not present

## 2022-10-08 LAB — COMPREHENSIVE METABOLIC PANEL
ALT: 63 U/L — ABNORMAL HIGH (ref 0–44)
AST: 47 U/L — ABNORMAL HIGH (ref 15–41)
Albumin: 3.9 g/dL (ref 3.5–5.0)
Alkaline Phosphatase: 69 U/L (ref 38–126)
Anion gap: 10 (ref 5–15)
BUN: 10 mg/dL (ref 6–20)
CO2: 25 mmol/L (ref 22–32)
Calcium: 9.1 mg/dL (ref 8.9–10.3)
Chloride: 104 mmol/L (ref 98–111)
Creatinine, Ser: 0.63 mg/dL (ref 0.44–1.00)
GFR, Estimated: >60 mL/min (ref >60)
Glucose, Bld: 106 mg/dL — ABNORMAL HIGH (ref 70–99)
Potassium: 3.5 mmol/L (ref 3.5–5.1)
Sodium: 139 mmol/L (ref 135–145)
Total Bilirubin: 1.2 mg/dL (ref 0.3–1.2)
Total Protein: 7.5 g/dL (ref 6.5–8.1)

## 2022-10-08 LAB — TYPE AND SCREEN
ABO/RH(D): A POS
Antibody Screen: NEGATIVE

## 2022-10-08 LAB — LIPASE, BLOOD: Lipase: 28 U/L (ref 11–51)

## 2022-10-08 LAB — CBC
HCT: 42.3 % (ref 36.0–46.0)
Hemoglobin: 14.1 g/dL (ref 12.0–15.0)
MCH: 30.5 pg (ref 26.0–34.0)
MCHC: 33.3 g/dL (ref 30.0–36.0)
MCV: 91.4 fL (ref 80.0–100.0)
Platelets: 165 10*3/uL (ref 150–400)
RBC: 4.63 MIL/uL (ref 3.87–5.11)
RDW: 14.2 % (ref 11.5–15.5)
WBC: 22.9 10*3/uL — ABNORMAL HIGH (ref 4.0–10.5)
nRBC: 0 % (ref 0.0–0.2)

## 2022-10-08 LAB — URINALYSIS, ROUTINE W REFLEX MICROSCOPIC
Bilirubin Urine: NEGATIVE
Glucose, UA: NEGATIVE mg/dL
Hgb urine dipstick: NEGATIVE
Ketones, ur: NEGATIVE mg/dL
Leukocytes,Ua: NEGATIVE
Nitrite: NEGATIVE
Protein, ur: NEGATIVE mg/dL
Specific Gravity, Urine: 1.018 (ref 1.005–1.030)
pH: 5 (ref 5.0–8.0)

## 2022-10-08 LAB — POC URINE PREG, ED: Preg Test, Ur: NEGATIVE

## 2022-10-08 MED ORDER — PANTOPRAZOLE SODIUM 40 MG IV SOLR
40.0000 mg | Freq: Once | INTRAVENOUS | Status: AC
  Administered 2022-10-08: 40 mg via INTRAVENOUS
  Filled 2022-10-08: qty 10

## 2022-10-08 MED ORDER — IOHEXOL 350 MG/ML SOLN
100.0000 mL | Freq: Once | INTRAVENOUS | Status: AC | PRN
  Administered 2022-10-08: 75 mL via INTRAVENOUS

## 2022-10-08 MED ORDER — SUCRALFATE 1 G PO TABS: 1.0000 g | ORAL_TABLET | Freq: Three times a day (TID) | ORAL | 0 refills | Status: AC

## 2022-10-08 MED ORDER — ONDANSETRON HCL 4 MG/2ML IJ SOLN
4.0000 mg | Freq: Once | INTRAMUSCULAR | Status: AC
  Administered 2022-10-08: 4 mg via INTRAVENOUS
  Filled 2022-10-08: qty 2

## 2022-10-08 MED ORDER — SUCRALFATE 1 G PO TABS
1.0000 g | ORAL_TABLET | Freq: Once | ORAL | Status: AC
  Administered 2022-10-08: 1 g via ORAL
  Filled 2022-10-08: qty 1

## 2022-10-08 MED ORDER — SUCRALFATE 1 G PO TABS: 1.0000 g | ORAL_TABLET | Freq: Three times a day (TID) | ORAL | 0 refills | Status: DC

## 2022-10-08 MED ORDER — FAMOTIDINE 20 MG PO TABS: 20.0000 mg | ORAL_TABLET | Freq: Two times a day (BID) | ORAL | 0 refills | Status: AC

## 2022-10-08 MED ORDER — FAMOTIDINE 20 MG PO TABS: 20.0000 mg | ORAL_TABLET | Freq: Two times a day (BID) | ORAL | 0 refills | Status: DC

## 2022-10-08 MED ORDER — OMEPRAZOLE MAGNESIUM 20 MG PO TBEC: 40.0000 mg | DELAYED_RELEASE_TABLET | Freq: Two times a day (BID) | ORAL | 0 refills | Status: AC

## 2022-10-08 MED ORDER — MORPHINE SULFATE (PF) 4 MG/ML IV SOLN
4.0000 mg | Freq: Once | INTRAVENOUS | Status: AC
  Administered 2022-10-08: 4 mg via INTRAVENOUS
  Filled 2022-10-08: qty 1

## 2022-10-08 MED ORDER — OMEPRAZOLE MAGNESIUM 20 MG PO TBEC: 40.0000 mg | DELAYED_RELEASE_TABLET | Freq: Two times a day (BID) | ORAL | 0 refills | Status: DC

## 2022-10-08 NOTE — ED Notes (Addendum)
Pt to ED for RUQ abdominal pain since 3-4 days, pain comes and goes, pt has had several waves of pain in front of this RN, pt recently ate out and it was chicken, unsure if it could be from this. Pt still has gallbladder. Urine sent. Pt also states that yesterday she vomited and it was all undigested food since the day before. Pt vomited several times yesterday and then last night she vomited blood. Pt has picture on phone. Pt also experienced some rectal bleeding after all the retching. LBM yesterday AM.

## 2022-10-08 NOTE — ED Provider Notes (Signed)
[facility] Provider Note    Event Date/Time   First MD Initiated Contact with Patient 10/08/22 1414     (approximate)   History   Abdominal Pain   HPI  Destiny Bryant is a 36 y.o. female who presents today for evaluation of abdominal pain and vomiting since Wednesday.   She reports that starting again yesterday she had excruciating epigastric abdominal pain radiating to her back, as well as a feeling of abdominal distention in her lower abdomen.  She had dark stools and 1 episode of coffee-ground emesis.  Currently denies chest pain or shortness of breath.  She reports that she has been taking both prednisone and ibuprofen on an empty stomach prescribed by the urgent care to help with a foot injury.  She reports that her foot feels significantly improved.  No fevers or chills.  There are no problems to display for this patient.         Physical Exam   Triage Vital Signs: ED Triage Vitals  Enc Vitals Group     BP 10/08/22 1348 (!) 166/120     Pulse Rate 10/08/22 1348 96     Resp 10/08/22 1348 18     Temp 10/08/22 1348 98.5 F (36.9 C)     Temp Source 10/08/22 1348 Oral     SpO2 10/08/22 1348 99 %     Weight 10/08/22 1349 212 lb (96.2 kg)     Height 10/08/22 1349 5\' 1"  (1.549 m)     Head Circumference --      Peak Flow --      Pain Score 10/08/22 1349 10     Pain Loc --      Pain Edu? --      Excl. in GC? --     Most recent vital signs: Vitals:   10/08/22 1348  BP: (!) 166/120  Pulse: 96  Resp: 18  Temp: 98.5 F (36.9 C)  SpO2: 99%    Physical Exam Vitals and nursing note reviewed.  Constitutional:      General: Awake and alert.  Appears uncomfortable    Appearance: Normal appearance. The patient is normal weight.  HENT:     Head: Normocephalic and atraumatic.     Mouth: Mucous membranes are moist.  Eyes:     General: PERRL. Normal EOMs        Right eye: No discharge.        Left eye: No discharge.     Conjunctiva/sclera:  Conjunctivae normal.  Cardiovascular:     Rate and Rhythm: Normal rate and regular rhythm.     Pulses: Normal pulses.     Heart sounds: Normal heart sounds Pulmonary:     Effort: Pulmonary effort is normal. No respiratory distress.     Breath sounds: Normal breath sounds.  Abdominal:     Abdomen is soft. There is diffuse abdominal tenderness in the epigastrium as well as in the lower quadrants. No rebound or guarding. No distention. Musculoskeletal:        General: No swelling. Normal range of motion.     Cervical back: Normal range of motion and neck supple.  Skin:    General: Skin is warm and dry.     Capillary Refill: Capillary refill takes less than 2 seconds.     Findings: No rash.  Neurological:     Mental Status: The patient is awake and alert.      ED Results / Procedures / Treatments  Labs (all labs ordered are listed, but only abnormal results are displayed) Labs Reviewed  COMPREHENSIVE METABOLIC PANEL - Abnormal; Notable for the following components:      Result Value   Glucose, Bld 106 (*)    AST 47 (*)    ALT 63 (*)    All other components within normal limits  CBC - Abnormal; Notable for the following components:   WBC 22.9 (*)    All other components within normal limits  URINALYSIS, ROUTINE W REFLEX MICROSCOPIC - Abnormal; Notable for the following components:   Color, Urine YELLOW (*)    APPearance HAZY (*)    All other components within normal limits  LIPASE, BLOOD  POC URINE PREG, ED  POC OCCULT BLOOD, ED  TYPE AND SCREEN     EKG     RADIOLOGY I independently reviewed and interpreted imaging and agree with radiologists findings.     PROCEDURES:  Critical Care performed:   Procedures   MEDICATIONS ORDERED IN ED: Medications  iohexol (OMNIPAQUE) 350 MG/ML injection 100 mL (75 mLs Intravenous Contrast Given 10/08/22 1452)  morphine (PF) 4 MG/ML injection 4 mg (4 mg Intravenous Given 10/08/22 1504)  ondansetron (ZOFRAN) injection 4  mg (4 mg Intravenous Given 10/08/22 1504)  pantoprazole (PROTONIX) injection 40 mg (40 mg Intravenous Given 10/08/22 1531)  sucralfate (CARAFATE) tablet 1 g (1 g Oral Given 10/08/22 1523)     IMPRESSION / MDM / ASSESSMENT AND PLAN / ED COURSE  I reviewed the triage vital signs and the nursing notes.   Differential diagnosis includes, but is not limited to, gastritis, peptic ulcer disease, biliary colic, cholecystitis, pancreatitis, gallstone pancreatitis, appendicitis, perforated viscus.  Patient is awake and alert, hemodynamically stable and afebrile, speaking in complete sentences, but intermittently appears to be uncomfortable.  She was placed on the monitor.  Labs obtained are revealing for a leukocytosis to 22.9, lipase normal, minimal elevation in AST and ALT to 47 and 63.  Per chart review, patient was seen at an urgent care on 10/05/2022 for evaluation of toe pain after she dropped a piece of frozen meat onto her toe.  Her x-ray was negative, but she was treated with prednisone, and reports that she has also been taking ibuprofen.  Given broad differential, CT scan was obtained.  In the meantime, she was treated with morphine and Zofran.  CT scan returned with moderate diffuse gastric wall thickening concerning for gastritis or peptic ulcer disease.  I suspect this is due to her recent use of Motrin and prednisone.  I discussed this with her and advised that she stop taking these medications.  I suspect this is why she had darker stools.  She was subsequently treated with Carafate and Protonix with complete resolution of her pain.  We discussed gastric irritants including alcohol, caffeine, spicy foods, acidic foods, etc. I advised that she stop using these until her pain resolves.  She was instructed to follow-up with gastroenterology as she will likely require an EGD.  She agrees to call and the appropriate information was provided.  She was started on omeprazole, Pepcid, and Carafate to help  facilitate healing of her gastric lining.  Patient understands and agrees with plan.  She was discharged in stable condition.  Her medications were originally sent to [facility] in [place] as she requested, but then she requested for them to be changed to [facility] in [place].  This change was made for her.  Patient's presentation is most consistent with acute complicated illness /  injury requiring diagnostic workup.   Clinical Course as of 10/08/22 1557  Sat Oct 08, 2022  1527 Patient reports feeling better. Discussed CT results and plan going forward. Patient comfortable with plan [JP]    Clinical Course User Index [JP] Makyah Lavigne, Herb Grays, PA-C     FINAL CLINICAL IMPRESSION(S) / ED DIAGNOSES   Final diagnoses:  Acute gastritis with hemorrhage, unspecified gastritis type     Rx / DC Orders   ED Discharge Orders          Ordered    omeprazole (PRILOSEC OTC) 20 MG tablet  2 times daily,   Status:  Discontinued        10/08/22 1531    famotidine (PEPCID) 20 MG tablet  2 times daily,   Status:  Discontinued        10/08/22 1531    sucralfate (CARAFATE) 1 g tablet  3 times daily with meals & bedtime,   Status:  Discontinued        10/08/22 1531    famotidine (PEPCID) 20 MG tablet  2 times daily        10/08/22 1555    omeprazole (PRILOSEC OTC) 20 MG tablet  2 times daily        10/08/22 1555    sucralfate (CARAFATE) 1 g tablet  3 times daily with meals & bedtime        10/08/22 1555             Note:  This document was prepared using Dragon voice recognition software and may include unintentional dictation errors.   Keturah Shavers 10/08/22 1606    Sharman Cheek, MD 10/08/22 1921

## 2022-10-08 NOTE — Discharge Instructions (Addendum)
Your CT scan shows thickening of your gastric lining which is representative of either gastritis or peptic ulcer disease which we discussed.  It is very important that you follow-up with gastroenterology.  Please medications as prescribed.  Please stay away from caffeine, alcohol, acidic foods, spicy foods, tobacco as these are highly irritating to the gastric lining.  Please also do not take ibuprofen (or any NSAID) or prednisone anymore.  Please return for any new, worsening, or change in symptoms or other concerns.  It was a pleasure caring for you today.

## 2022-10-08 NOTE — ED Notes (Signed)
Urine specimen sent to lab at this time.

## 2022-10-08 NOTE — ED Triage Notes (Signed)
Patient arrives by POV c/o abdominal pain and vomiting since Wednesday. Last night started vomiting and having blood in her stool. C/o sharp upper abdominal pain radiating into back.

## 2022-10-27 ENCOUNTER — Ambulatory Visit
Admission: EM | Admit: 2022-10-27 | Discharge: 2022-10-27 | Disposition: A | Discharge status: Home / Self Care | Payer: Medicaid Other | Attending: Emergency Medicine | Admitting: Emergency Medicine

## 2022-10-27 DIAGNOSIS — M542 Cervicalgia: Secondary | ICD-10-CM

## 2022-10-27 DIAGNOSIS — M5442 Lumbago with sciatica, left side: Secondary | ICD-10-CM

## 2022-10-27 MED ORDER — KETOROLAC TROMETHAMINE 30 MG/ML IJ SOLN
30.0000 mg | Freq: Once | INTRAMUSCULAR | Status: AC
  Administered 2022-10-27: 30 mg via INTRAMUSCULAR

## 2022-10-27 MED ORDER — PREDNISONE 20 MG PO TABS: 40.0000 mg | ORAL_TABLET | Freq: Every day | ORAL | 0 refills | Status: DC

## 2022-10-27 NOTE — Discharge Instructions (Addendum)
Your pain is most likely caused by irritation to the muscles   You have been given an injection of Toradol today here in office, ideally you will start to see relief in about 30 minutes  Starting tomorrow take prednisone every morning with food for the next 5 days to continue the process above  You may use heating pad in 15 minute intervals as needed for additional comfort,  you may find comfort in using ice in 10-15 minutes over affected area  Begin stretching affected area daily for 10 minutes as tolerated to further loosen muscles   When lying down place pillow underneath arms, behind back  Practice good posture: head back, shoulders back, chest forward, pelvis back and weight distributed evenly on both legs  If pain persist after recommended treatment or reoccurs if may be beneficial to follow up with orthopedic specialist for evaluation, this doctor specializes in the bones and can manage your symptoms long-term with options such as but not limited to imaging, medications or physical therapy
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
## 2023-06-09 ENCOUNTER — Ambulatory Visit
Admission: EM | Admit: 2023-06-09 | Discharge: 2023-06-09 | Disposition: A | Discharge status: Home / Self Care | Payer: BLUE CROSS/BLUE SHIELD | Attending: Family Medicine | Admitting: Family Medicine

## 2023-06-09 DIAGNOSIS — R197 Diarrhea, unspecified: Secondary | ICD-10-CM | POA: Diagnosis not present

## 2023-06-09 DIAGNOSIS — R1084 Generalized abdominal pain: Secondary | ICD-10-CM

## 2023-06-09 NOTE — ED Triage Notes (Signed)
PT c/o stomach pain x5days  Pt states that she has a history of gastritis and had abdominal pain and frequent urgency to use the restroom starting on Monday morning and felt better on Wednesday.   Pt states that she had shrimp on Sunday and Thursday and got sharp pains the day after each time.   Pt states that the her last bowel movement was this morning and felt complete, but she would get a sharp pain and then she would have urgency again.   Pt has tried sucralfate to help with the stomach pain.   Pt denies any gas.   Pt states that the pain is along the navel.

## 2023-06-09 NOTE — ED Provider Notes (Addendum)
[facility]    CSN: [id number] Arrival date & time: 06/09/23  1655      History   Chief Complaint Chief Complaint  Patient presents with   Abdominal Pain    HPI Destiny Bryant is a 37 y.o. female.   HPI  Destiny Bryant presents for abdominal pain for the past 5 days. Reports history of gastritis. She went out of town and ate seafood on Sunday and then later that night she started having sharp pain. The next day, she starting having diarrhea. Wednesday, the frequency of diarrhea slowed down but she wasn't eating either. She was able to eat a full meal yesterday and had a solid bowl movement but   She had shrimp again last night and the loose stool returned. Watery non-bloody stools. Has foul smelling stools.  She didn't work the past 2 days and hasn't been taking her blood pressure. She tested for COVID and it was tested negative, on Tuesday. She typically is constipated.    Past Medical History:  Diagnosis Date   Hypertension    Sleep apnea     There are no problems to display for this patient.   Past Surgical History:  Procedure Laterality Date   ROTATOR CUFF REPAIR     SHOULDER ARTHROSCOPY WITH BICEPS TENDON REPAIR Right 09/10/2021   Procedure: Right shoulder arthroscopy with Glenohumeral debridement and biceps tenodesis;  Surgeon: Signa Kell, MD;  Location: [facility];  Service: Orthopedics;  Laterality: Right;    OB History   No obstetric history on file.      Home Medications    Prior to Admission medications   Medication Sig Start Date End Date Taking? Authorizing Provider  albuterol (VENTOLIN HFA) 108 (90 Base) MCG/ACT inhaler Inhale 1-2 puffs into the lungs every 4 (four) hours as needed for wheezing or shortness of breath. Patient taking differently: Inhale 1-2 puffs into the lungs every 4 (four) hours as needed for wheezing or shortness of breath. Needs refill. 10/26/21   Isa Rankin, MD  cetirizine (ZYRTEC) 10 MG tablet Take  1 tablet (10 mg total) by mouth daily. 05/22/22   Bailey Mech, NP  famotidine (PEPCID) 20 MG tablet Take 1 tablet (20 mg total) by mouth 2 (two) times daily for 28 days. 10/08/22 11/05/22  Poggi, Herb Grays, PA-C  hydrochlorothiazide (HYDRODIURIL) 25 MG tablet Take 25 mg by mouth daily. 07/14/20   [provider]  montelukast (SINGULAIR) 10 MG tablet Take 1 tablet (10 mg total) by mouth at bedtime. 05/22/22   Bailey Mech, NP  omeprazole (PRILOSEC OTC) 20 MG tablet Take 2 tablets (40 mg total) by mouth in the morning and at bedtime for 28 days. 30 minutes before meal 10/08/22 11/05/22  Poggi, Eileen Stanford E, PA-C  sucralfate (CARAFATE) 1 g tablet Take 1 tablet (1 g total) by mouth 4 (four) times daily -  with meals and at bedtime for 21 days. 10/08/22 10/29/22  Poggi, Herb Grays, PA-C    Family History Family History  Problem Relation Age of Onset   Hypertension Mother    Hypertension Father     Social History Social History   Tobacco Use   Smoking status: Some Days    Current packs/day: 0.25    Types: Cigarettes   Smokeless tobacco: Never   Tobacco comments:    1 cig every 2-3 days  Vaping Use   Vaping status: Never Used  Substance Use Topics   Alcohol use: Yes    Comment: social  Drug use: Never     Allergies   Patient has no known allergies.   Review of Systems Review of Systems :negative unless otherwise stated in HPI.      Physical Exam Triage Vital Signs ED Triage Vitals  Encounter Vitals Group     BP --      Systolic BP Percentile --      Diastolic BP Percentile --      Pulse --      Resp --      Temp --      Temp src --      SpO2 --      Weight 06/09/23 1730 194 lb (88 kg)     Height 06/09/23 1730 5\' 1"  (1.549 m)     Head Circumference --      Peak Flow --      Pain Score 06/09/23 1729 8     Pain Loc --      Pain Education --      Exclude from Growth Chart --    No data found.  Updated Vital Signs BP (!) 154/121 (BP Location: Left Arm)    Pulse (!) 109   Temp 100 F (37.8 C) (Oral)   Ht 5\' 1"  (1.549 m)   Wt 88 kg   LMP 06/02/2023   SpO2 97%   BMI 36.66 kg/m   Visual Acuity Right Eye Distance:   Left Eye Distance:   Bilateral Distance:    Right Eye Near:   Left Eye Near:    Bilateral Near:     Physical Exam  GEN: well appearing female, in no acute distress  CV: tachycardic, well perfused  RESP: no increased work of breathing ABD: not performed, pt states she needs to go  SKIN:  no rash on visible skin NEURO: alert, oriented   UC Treatments / Results  Labs (all labs ordered are listed, but only abnormal results are displayed) Labs Reviewed - No data to display  EKG  If EKG performed, see my interpretation and MDM section  Radiology No results found.   Procedures Procedures (including critical care time)  Medications Ordered in UC Medications - No data to display  Initial Impression / Assessment and Plan / UC Course  I have reviewed the triage vital signs and the nursing notes.  Pertinent labs & imaging results that were available during my care of the patient were reviewed by me and considered in my medical decision making (see chart for details).       Patient is a  37 y.o. female with history acid reflux,  who presents after having insidious abdominal pain about 5 days ago. [name] afebrile. She is tachycardic and hypertensive with elevated temperature.     On chart review, pt had CT ABD on 10/08/22 that showed moderate diffuse gastric wall thickening is noted concerning for gastritis or peptic ulcer disease. Also noted are mildly enlarged lymph nodes in the gastrohepatic ligament and along the greater curvature of the stomach which may be inflammatory in etiology. Endoscopy is recommended for further evaluation.   Given her symptoms, vitals and former CT, recommended that she go to the emergency department for abdominal imaging and STAT labs.   Follow-up, return and ED precautions  given. Discussed MDM, treatment plan and plan for follow-up with patient who agrees with plan.    Final Clinical Impressions(s) / UC Diagnoses   Final diagnoses:  Generalized abdominal pain  Diarrhea, unspecified type     Discharge Instructions  You have been advised to follow up immediately in the emergency department for concerning signs or symptoms as discussed during your visit. If you declined EMS transport, please have a family member take you directly to the ED at this time. Do not delay.   Based on concerns about condition, if you do not follow up in the ED, you may risk poor outcomes including worsening of condition, delayed treatment and potentially life threatening issues. If you have declined to go to the ED at this time, you should call your PCP immediately to set up a follow up appointment.     ED Prescriptions   None    PDMP not reviewed this encounter.      Katha Cabal, DO 06/17/23 2330

## 2023-06-09 NOTE — Discharge Instructions (Addendum)

## 2024-01-01 ENCOUNTER — Ambulatory Visit (INDEPENDENT_AMBULATORY_CARE_PROVIDER_SITE_OTHER): Payer: No Typology Code available for payment source

## 2024-01-01 ENCOUNTER — Ambulatory Visit
Admission: EM | Admit: 2024-01-01 | Discharge: 2024-01-01 | Disposition: A | Discharge status: Home / Self Care | Payer: BLUE CROSS/BLUE SHIELD | Attending: Emergency Medicine | Admitting: Emergency Medicine

## 2024-01-01 ENCOUNTER — Encounter: Payer: Self-pay | Admitting: Emergency Medicine

## 2024-01-01 DIAGNOSIS — R0781 Pleurodynia: Secondary | ICD-10-CM

## 2024-01-01 DIAGNOSIS — S20212A Contusion of left front wall of thorax, initial encounter: Secondary | ICD-10-CM | POA: Diagnosis not present

## 2024-01-01 MED ORDER — HYDROCODONE-ACETAMINOPHEN 5-325 MG PO TABS: 1.0000 | ORAL_TABLET | Freq: Four times a day (QID) | ORAL | 0 refills | Status: AC | PRN

## 2024-01-01 MED ORDER — IBUPROFEN 600 MG PO TABS: 600.0000 mg | ORAL_TABLET | Freq: Four times a day (QID) | ORAL | 0 refills | Status: AC | PRN
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
## 2024-01-01 NOTE — Discharge Instructions (Signed)
Your chest x-ray did not show any evidence of broken ribs.  Your exam is consistent with a bruised rib.  Use 6 and milligrams of ibuprofen every 6 hours with food to help with pain and inflammation.  You may apply topical Salonpas or over-the-counter lidocaine patches to the area of pain every 8 hours as needed.  Use the Norco at bedtime as needed for pain.  If you develop any new or worsening symptoms please return for reevaluation or follow-up with your primary care provider.
